# Patient Record
Sex: Male | Born: 1960 | Hispanic: Yes | Marital: Single | Smoking: Never smoker
Health system: Southern US, Community
[De-identification: ages and names within clinical notes are randomized; demographics above are authoritative.]

## PROBLEM LIST (undated history)

## (undated) ENCOUNTER — Emergency Department (HOSPITAL_COMMUNITY): Payer: Self-pay

## (undated) DIAGNOSIS — I10 Essential (primary) hypertension: Secondary | ICD-10-CM

## (undated) DIAGNOSIS — E119 Type 2 diabetes mellitus without complications: Secondary | ICD-10-CM

## (undated) DIAGNOSIS — K219 Gastro-esophageal reflux disease without esophagitis: Secondary | ICD-10-CM

## (undated) DIAGNOSIS — N289 Disorder of kidney and ureter, unspecified: Secondary | ICD-10-CM

## (undated) DIAGNOSIS — I509 Heart failure, unspecified: Secondary | ICD-10-CM

## (undated) HISTORY — DX: Heart failure, unspecified: I50.9

---

## 2015-06-22 HISTORY — PX: AV FISTULA PLACEMENT: SHX1204

## 2017-07-16 ENCOUNTER — Observation Stay (HOSPITAL_COMMUNITY)
Admission: EM | Admit: 2017-07-16 | Discharge: 2017-07-17 | Disposition: A | Discharge status: Home / Self Care | Payer: Self-pay | Attending: Internal Medicine | Admitting: Internal Medicine

## 2017-07-16 ENCOUNTER — Other Ambulatory Visit: Payer: Self-pay

## 2017-07-16 ENCOUNTER — Encounter (HOSPITAL_COMMUNITY): Payer: Self-pay | Admitting: *Deleted

## 2017-07-16 ENCOUNTER — Emergency Department (HOSPITAL_COMMUNITY): Payer: Self-pay

## 2017-07-16 DIAGNOSIS — Z7982 Long term (current) use of aspirin: Secondary | ICD-10-CM | POA: Insufficient documentation

## 2017-07-16 DIAGNOSIS — E8779 Other fluid overload: Principal | ICD-10-CM | POA: Insufficient documentation

## 2017-07-16 DIAGNOSIS — I878 Other specified disorders of veins: Secondary | ICD-10-CM

## 2017-07-16 DIAGNOSIS — Z9115 Patient's noncompliance with renal dialysis: Secondary | ICD-10-CM | POA: Insufficient documentation

## 2017-07-16 DIAGNOSIS — Z79899 Other long term (current) drug therapy: Secondary | ICD-10-CM | POA: Insufficient documentation

## 2017-07-16 DIAGNOSIS — K219 Gastro-esophageal reflux disease without esophagitis: Secondary | ICD-10-CM | POA: Insufficient documentation

## 2017-07-16 DIAGNOSIS — N2581 Secondary hyperparathyroidism of renal origin: Secondary | ICD-10-CM | POA: Insufficient documentation

## 2017-07-16 DIAGNOSIS — R262 Difficulty in walking, not elsewhere classified: Secondary | ICD-10-CM | POA: Insufficient documentation

## 2017-07-16 DIAGNOSIS — I132 Hypertensive heart and chronic kidney disease with heart failure and with stage 5 chronic kidney disease, or end stage renal disease: Secondary | ICD-10-CM | POA: Insufficient documentation

## 2017-07-16 DIAGNOSIS — Z800 Family history of malignant neoplasm of digestive organs: Secondary | ICD-10-CM

## 2017-07-16 DIAGNOSIS — E8770 Fluid overload, unspecified: Secondary | ICD-10-CM

## 2017-07-16 DIAGNOSIS — D649 Anemia, unspecified: Secondary | ICD-10-CM | POA: Insufficient documentation

## 2017-07-16 DIAGNOSIS — E8889 Other specified metabolic disorders: Secondary | ICD-10-CM | POA: Insufficient documentation

## 2017-07-16 DIAGNOSIS — Z833 Family history of diabetes mellitus: Secondary | ICD-10-CM

## 2017-07-16 DIAGNOSIS — Z992 Dependence on renal dialysis: Secondary | ICD-10-CM | POA: Insufficient documentation

## 2017-07-16 DIAGNOSIS — E1122 Type 2 diabetes mellitus with diabetic chronic kidney disease: Secondary | ICD-10-CM | POA: Insufficient documentation

## 2017-07-16 DIAGNOSIS — R0600 Dyspnea, unspecified: Secondary | ICD-10-CM

## 2017-07-16 DIAGNOSIS — Z8249 Family history of ischemic heart disease and other diseases of the circulatory system: Secondary | ICD-10-CM

## 2017-07-16 DIAGNOSIS — N186 End stage renal disease: Secondary | ICD-10-CM | POA: Insufficient documentation

## 2017-07-16 DIAGNOSIS — R0602 Shortness of breath: Secondary | ICD-10-CM

## 2017-07-16 DIAGNOSIS — I509 Heart failure, unspecified: Secondary | ICD-10-CM | POA: Insufficient documentation

## 2017-07-16 HISTORY — DX: Type 2 diabetes mellitus without complications: E11.9

## 2017-07-16 HISTORY — DX: Essential (primary) hypertension: I10

## 2017-07-16 HISTORY — DX: Gastro-esophageal reflux disease without esophagitis: K21.9

## 2017-07-16 HISTORY — DX: Disorder of kidney and ureter, unspecified: N28.9

## 2017-07-16 LAB — BASIC METABOLIC PANEL
ANION GAP: 19 — AB (ref 5–15)
BUN: 119 mg/dL — ABNORMAL HIGH (ref 6–20)
CALCIUM: 8.3 mg/dL — AB (ref 8.9–10.3)
CO2: 26 mmol/L (ref 22–32)
Chloride: 97 mmol/L — ABNORMAL LOW (ref 101–111)
Creatinine, Ser: 14.66 mg/dL — ABNORMAL HIGH (ref 0.61–1.24)
GFR, EST AFRICAN AMERICAN: 4 mL/min — AB (ref >60)
GFR, EST NON AFRICAN AMERICAN: 3 mL/min — AB (ref >60)
Glucose, Bld: 182 mg/dL — ABNORMAL HIGH (ref 65–99)
Potassium: 5.6 mmol/L — ABNORMAL HIGH (ref 3.5–5.1)
SODIUM: 142 mmol/L (ref 135–145)

## 2017-07-16 LAB — CBC
HCT: 37.9 % — ABNORMAL LOW (ref 39.0–52.0)
HEMOGLOBIN: 12.2 g/dL — AB (ref 13.0–17.0)
MCH: 27.2 pg (ref 26.0–34.0)
MCHC: 32.2 g/dL (ref 30.0–36.0)
MCV: 84.6 fL (ref 78.0–100.0)
Platelets: 114 10*3/uL — ABNORMAL LOW (ref 150–400)
RBC: 4.48 MIL/uL (ref 4.22–5.81)
RDW: 18.7 % — ABNORMAL HIGH (ref 11.5–15.5)
WBC: 5.7 10*3/uL (ref 4.0–10.5)

## 2017-07-16 MED ORDER — AMLODIPINE BESYLATE 10 MG PO TABS
10.0000 mg | ORAL_TABLET | Freq: Every day | ORAL | Status: DC
  Administered 2017-07-17: 10 mg via ORAL
  Filled 2017-07-16 (×2): qty 1

## 2017-07-16 MED ORDER — ISOSORB DINITRATE-HYDRALAZINE 20-37.5 MG PO TABS
1.0000 | ORAL_TABLET | Freq: Three times a day (TID) | ORAL | Status: DC
  Administered 2017-07-16 – 2017-07-17 (×2): 1 via ORAL
  Filled 2017-07-16 (×3): qty 1

## 2017-07-16 MED ORDER — PANTOPRAZOLE SODIUM 40 MG PO TBEC
40.0000 mg | DELAYED_RELEASE_TABLET | Freq: Every day | ORAL | Status: DC
  Administered 2017-07-17: 40 mg via ORAL
  Filled 2017-07-16 (×2): qty 1

## 2017-07-16 MED ORDER — PROMETHAZINE HCL 25 MG PO TABS: 12.5000 mg | ORAL_TABLET | Freq: Four times a day (QID) | ORAL | Status: DC | PRN

## 2017-07-16 MED ORDER — CARVEDILOL 25 MG PO TABS
25.0000 mg | ORAL_TABLET | Freq: Two times a day (BID) | ORAL | Status: DC
  Administered 2017-07-16 – 2017-07-17 (×2): 25 mg via ORAL
  Filled 2017-07-16 (×2): qty 1

## 2017-07-16 MED ORDER — SUCRALFATE 1 GM/10ML PO SUSP
1.0000 g | Freq: Three times a day (TID) | ORAL | Status: DC
  Administered 2017-07-16 – 2017-07-17 (×2): 1 g via ORAL
  Filled 2017-07-16 (×4): qty 10

## 2017-07-16 MED ORDER — RENA-VITE PO TABS
1.0000 | ORAL_TABLET | Freq: Every day | ORAL | Status: DC
  Administered 2017-07-16: 1 via ORAL
  Filled 2017-07-16 (×2): qty 1

## 2017-07-16 MED ORDER — ACETAMINOPHEN 650 MG RE SUPP: 650.0000 mg | Freq: Four times a day (QID) | RECTAL | Status: DC | PRN

## 2017-07-16 MED ORDER — ASPIRIN EC 81 MG PO TBEC
81.0000 mg | DELAYED_RELEASE_TABLET | Freq: Every day | ORAL | Status: DC
  Administered 2017-07-17: 81 mg via ORAL
  Filled 2017-07-16: qty 1

## 2017-07-16 MED ORDER — LISINOPRIL 40 MG PO TABS
40.0000 mg | ORAL_TABLET | Freq: Every day | ORAL | Status: DC
  Administered 2017-07-17: 40 mg via ORAL
  Filled 2017-07-16: qty 1

## 2017-07-16 MED ORDER — ACETAMINOPHEN 325 MG PO TABS: 650.0000 mg | ORAL_TABLET | Freq: Four times a day (QID) | ORAL | Status: DC | PRN

## 2017-07-16 MED ORDER — FERRIC CITRATE 1 GM 210 MG(FE) PO TABS
420.0000 mg | ORAL_TABLET | Freq: Three times a day (TID) | ORAL | Status: DC
  Administered 2017-07-17: 420 mg via ORAL
  Filled 2017-07-16 (×3): qty 2

## 2017-07-16 MED ORDER — HEPARIN SODIUM (PORCINE) 5000 UNIT/ML IJ SOLN
5000.0000 [IU] | Freq: Three times a day (TID) | INTRAMUSCULAR | Status: DC
  Administered 2017-07-16 – 2017-07-17 (×3): 5000 [IU] via SUBCUTANEOUS
  Filled 2017-07-16 (×3): qty 1

## 2017-07-16 MED ORDER — SODIUM CHLORIDE 0.9% FLUSH
3.0000 mL | Freq: Two times a day (BID) | INTRAVENOUS | Status: DC
  Administered 2017-07-16: 3 mL via INTRAVENOUS

## 2017-07-16 NOTE — ED Notes (Signed)
Pt ambulated to room from waiting room, tolerated well. 

## 2017-07-16 NOTE — ED Notes (Signed)
Attempted to call report

## 2017-07-16 NOTE — ED Provider Notes (Signed)
[facility] EMERGENCY DEPARTMENT Provider Note   CSN: [id number] Arrival date & time: 07/16/17  1011     History   Chief Complaint Chief Complaint  Patient presents with  . Shortness of Breath  . Vascular Access Problem    Missed dialysis    HPI Roger Rollins is a 56 y.o. male.  HPI   Roger Rollins is a 56 y.o. male, with a history of DM, GERD, HTN, and renal failure on dialysis, presenting to the ED with shortness of breath and peripheral edema for the last 3 days.  States he has moved from [place] to [place] last week. Was told he was cleared to move to his new dialysis center on [address] in [place]. Went there today and was told they were not ready for him because they did not have a H&P from the new MD. Last dialysis Thursday, November 22.  Complains of shortness of breath, orthopnea, chest tightness with orthopnea, as well as worsening peripheral edema.  Denies fever/chills, vomiting/diarrhea, cough, or any other complaints.  Past Medical History:  Diagnosis Date  . Diabetes mellitus without complication (HCC)   . GERD (gastroesophageal reflux disease)   . Hypertension   . Renal disorder     Patient Active Problem List   Diagnosis Date Noted  . Dyspnea 07/16/2017    Past Surgical History:  Procedure Laterality Date  . AV FISTULA PLACEMENT         Home Medications    Prior to Admission medications   Medication Sig Start Date End Date Taking? Authorizing Provider  amLODipine (NORVASC) 10 MG tablet Take 10 mg by mouth daily.   Yes [provider]  aspirin EC 81 MG tablet Take 81 mg by mouth daily.   Yes [provider]  calcium carbonate (OS-CAL) 1250 (500 Ca) MG chewable tablet Chew 1 tablet by mouth 2 (two) times daily.   Yes [provider]  carvedilol (COREG) 25 MG tablet Take 25 mg by mouth 2 (two) times daily with a meal.   Yes [provider]  ferric citrate (AURYXIA) 1 GM 210  MG(Fe) tablet Take 210 mg by mouth daily.   Yes [provider]  Iron Polysacch Cmplx-B12-FA 150-0.025-1 MG TABS Take 150 mg by mouth 2 (two) times daily at 10 AM and 5 PM.   Yes [provider]  isosorbide-hydrALAZINE (BIDIL) 20-37.5 MG tablet Take 1 tablet by mouth 3 (three) times daily.   Yes [provider]  lisinopril (PRINIVIL,ZESTRIL) 40 MG tablet Take 40 mg by mouth daily.   Yes [provider]  omeprazole (PRILOSEC) 20 MG capsule Take 20 mg by mouth 2 (two) times daily before a meal.   Yes [provider]  sucralfate (CARAFATE) 1 GM/10ML suspension Take 1 g by mouth 4 (four) times daily -  with meals and at bedtime.   Yes [provider]    Family History No family history on file.  Social History Social History   Tobacco Use  . Smoking status: Never Smoker  . Smokeless tobacco: Never Used  Substance Use Topics  . Alcohol use: No    Frequency: Never  . Drug use: No     Allergies   Patient has no known allergies.   Review of Systems Review of Systems  Constitutional: Negative for chills, diaphoresis and fever.  Respiratory: Positive for shortness of breath.   Cardiovascular: Positive for chest pain and leg swelling.  Gastrointestinal: Positive for nausea. Negative for vomiting.  Neurological: Negative for dizziness and light-headedness.  All other systems reviewed and are negative.    Physical Exam Updated Vital Signs BP (!) 174/88   Pulse 69   Temp 97.7 F (36.5 C) (Oral)   Resp 20   SpO2 97%   Physical Exam  Constitutional: He appears well-developed and well-nourished. No distress.  HENT:  Head: Normocephalic and atraumatic.  Eyes: Conjunctivae are normal.  Neck: Neck supple.  Cardiovascular: Normal rate, regular rhythm, normal heart sounds and intact distal pulses.  Pulmonary/Chest: He has rales in the right lower field and the left lower field.  SPO2 92% on room air. Orthopnea.   Abdominal:  Soft. There is no tenderness. There is no guarding.  Musculoskeletal: He exhibits edema.  Bilateral lower extremity pitting edema.  Lymphadenopathy:    He has no cervical adenopathy.  Neurological: He is alert.  Skin: Skin is warm and dry. He is not diaphoretic.  Psychiatric: He has a normal mood and affect. His behavior is normal.  Nursing note and vitals reviewed.    ED Treatments / Results  Labs (all labs ordered are listed, but only abnormal results are displayed) Labs Reviewed  BASIC METABOLIC PANEL - Abnormal; Notable for the following components:      Result Value   Potassium 5.6 (*)    Chloride 97 (*)    Glucose, Bld 182 (*)    BUN 119 (*)    Creatinine, Ser 14.66 (*)    Calcium 8.3 (*)    GFR calc non Af Amer 3 (*)    GFR calc Af Amer 4 (*)    Anion gap 19 (*)    All other components within normal limits  CBC - Abnormal; Notable for the following components:   Hemoglobin 12.2 (*)    HCT 37.9 (*)    RDW 18.7 (*)    Platelets 114 (*)    All other components within normal limits    EKG  EKG Interpretation  Date/Time:  Monday July 16 2017 10:43:48 EST Ventricular Rate:  66 PR Interval:  168 QRS Duration: 162 QT Interval:  486 QTC Calculation: 509 R Axis:   -93 Text Interpretation:  Normal sinus rhythm Right bundle branch block , plus right ventricular hypertrophy Minimal voltage criteria for LVH, may be normal variant Abnormal ECG No significant change since last tracing Confirmed by PowayMackuen, Courteney ([id number]) on 07/16/2017 12:49:22 PM       Radiology Dg Chest Portable 1 View  Result Date: 07/16/2017 CLINICAL DATA:  Shortness breath, patient on dialysis EXAM: PORTABLE CHEST 1 VIEW COMPARISON:  None. FINDINGS: There is mild bilateral interstitial thickening. There is no focal consolidation. There is no pleural effusion or pneumothorax. There is cardiomegaly. The osseous structures are unremarkable. IMPRESSION: 1. Cardiomegaly with mild pulmonary vascular  congestion. Electronically Signed   By: Elige KoHetal  Patel   On: 07/16/2017 13:13    Procedures Procedures (including critical care time)  Medications Ordered in ED Medications - No data to display   Initial Impression / Assessment and Plan / ED Course  I have reviewed the triage vital signs and the nursing notes.  Pertinent labs & imaging results that were available during my care of the patient were reviewed by me and considered in my medical decision making (see chart for details).  Clinical Course as of Jul 16 1502  Mon Jul 16, 2017  1354 Spoke with Dr. Lowell GuitarPowell, nephrologist.  States he will set up dialysis for the patient here in the hospital.  Requests we admit via hospitalist.  [SJ]  1458 Spoke with Dr. Johnny Bridge, IM resident. Agrees to admit the patient.   [SJ]    Clinical Course User Index [SJ] Joy, Shawn C, PA-C   Patient presents with shortness of breath and peripheral edema in the setting of overdue dialysis. Patient is nontoxic appearing, afebrile, not tachycardic, not tachypneic, not hypotensive, and is in no apparent distress.  Admission for dialysis today due to evidence of fluid overload.   Findings and plan of care discussed with Bary Castilla, MD. Dr. Corlis Leak personally evaluated and examined this patient.  Vitals:   07/16/17 1315 07/16/17 1330 07/16/17 1345 07/16/17 1400  BP: (!) 183/86 (!) 186/87 (!) 184/89 (!) 173/79  Pulse: 63 63 64 63  Resp: 14 10 12 12   Temp:      TempSrc:      SpO2: 95% 96% 95%      Final Clinical Impressions(s) / ED Diagnoses   Final diagnoses:  Shortness of breath    ED Discharge Orders    None       Concepcion Living 07/16/17 1505    Abelino Derrick, MD 07/17/17 [phone]

## 2017-07-16 NOTE — ED Triage Notes (Addendum)
Pt states he has been receiving dialysis in [place] and then moved here and had it set up.  Pt states he went today and they said they are not aware of him.  Sent here because they do need his history.  Last HD was Thursdays (TTS schedule).  Missed Saturday's dialysis.  Pt reports sob and swelling.  Legs hurt

## 2017-07-16 NOTE — Consult Note (Signed)
[facility] Renal Consultation Note  Indication for Consultation:  Management of ESRD/hemodialysis; anemia, hypertension/volume and secondary hyperparathyroidism  HPI: Roger Rollins is a 56 y.o. 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 He had plans to transfer to [facility] here (however, not accepted yet by Dr. Jimmy Footman as all transfer paperwork not complete )He decided to move here anyway. Now cos sob, swelling of my legs ,secondary to  missing HD Sat Also, "I Overdid it with [employer] in [place]. With son "). He reports usually minimal uf at op hd secondary to exercising  daily  and little wt gains between txs." He denies cough, fevers, chills, vomiting , diarrhea  Or other cos.  Denies  Any recent problems using his LUA AVF .   Reports moving to [facility] to be closer to daughter/ son and soon to be grand baby  here.    Past Medical History:  Diagnosis Date  . Diabetes mellitus without complication ([place])   . GERD (gastroesophageal reflux disease)   . Hypertension   . Renal disorder     Past Surgical History:  Procedure Laterality Date  . AV FISTULA PLACEMENT       No family history on file.    reports that  has never smoked. he has never used smokeless tobacco. He reports that he does not drink alcohol or use drugs.  No Known Allergies  Prior to Admission medications   Medication Sig Start Date End Date Taking? Authorizing Provider  amLODipine (NORVASC) 10 MG tablet Take 10 mg by mouth daily.   Yes [provider]  aspirin EC 81 MG tablet Take 81 mg by mouth daily.   Yes [provider]  calcium carbonate (OS-CAL) 1250 (500 Ca) MG chewable tablet Chew 1 tablet by mouth 2 (two) times daily.   Yes [provider]  carvedilol (COREG) 25 MG tablet Take 25 mg by mouth 2 (two) times daily with a meal.   Yes [provider]  ferric citrate (AURYXIA) 1  GM 210 MG(Fe) tablet Take 210 mg by mouth daily.   Yes [provider]  Iron Polysacch Cmplx-B12-FA 150-0.025-1 MG TABS Take 150 mg by mouth 2 (two) times daily at 10 AM and 5 PM.   Yes [provider]  isosorbide-hydrALAZINE (BIDIL) 20-37.5 MG tablet Take 1 tablet by mouth 3 (three) times daily.   Yes [provider]  lisinopril (PRINIVIL,ZESTRIL) 40 MG tablet Take 40 mg by mouth daily.   Yes [provider]  omeprazole (PRILOSEC) 20 MG capsule Take 20 mg by mouth 2 (two) times daily before a meal.   Yes [provider]  sucralfate (CARAFATE) 1 GM/10ML suspension Take 1 g by mouth 4 (four) times daily -  with meals and at bedtime.   Yes [provider]     Anti-infectives (From admission, onward)   None      Results for orders placed or performed during the hospital encounter of 07/16/17 (from the past 48 hour(s))  Basic metabolic panel     Status: Abnormal   Collection Time: 07/16/17 10:35 AM  Result Value Ref Range   Sodium 142 135 - 145 mmol/L   Potassium 5.6 (H) 3.5 - 5.1 mmol/L   Chloride 97 (L) 101 - 111 mmol/L   CO2 26 22 - 32 mmol/L   Glucose, Bld 182 (H) 65 - 99 mg/dL   BUN 119 (  H) 6 - 20 mg/dL   Creatinine, Ser 14.66 (H) 0.61 - 1.24 mg/dL   Calcium 8.3 (L) 8.9 - 10.3 mg/dL   GFR calc non Af Amer 3 (L) >60 mL/min   GFR calc Af Amer 4 (L) >60 mL/min    Comment: (NOTE) The eGFR has been calculated using the CKD EPI equation. This calculation has not been validated in all clinical situations. eGFR's persistently <60 mL/min signify possible Chronic Kidney Disease.    Anion gap 19 (H) 5 - 15  CBC     Status: Abnormal   Collection Time: 07/16/17 10:35 AM  Result Value Ref Range   WBC 5.7 4.0 - 10.5 K/uL   RBC 4.48 4.22 - 5.81 MIL/uL   Hemoglobin 12.2 (L) 13.0 - 17.0 g/dL   HCT 37.9 (L) 39.0 - 52.0 %   MCV 84.6 78.0 - 100.0 fL   MCH 27.2 26.0 - 34.0 pg   MCHC 32.2 30.0 - 36.0 g/dL   RDW 18.7 (H) 11.5 - 15.5 %    Platelets 114 (L) 150 - 400 K/uL    Comment: PLATELET COUNT CONFIRMED BY SMEAR    ROS: as in HPI   Physical Exam: Vitals:   07/16/17 1515 07/16/17 1530  BP:  (!) 179/79  Pulse: 68 67  Resp:  (!) 21  Temp:    SpO2: 94%      General: Alert thin Hispanic Male , NAD , Appropriate  OX3  HEENT: [place]  nonicteric , EOMI  Neck: JVD Pos.  Heart: RRR, No m, r, gallop Lungs: Non labored breathing  Faint rales bilat  bases Abdomen: Bs pos. Soft ,epigastric mildly tender, nondistended Extremities: 2-3 + bipedal edema  Skin: no rash, warm dry Neuro: OX4 alert moving all extrem . No asterixis , no acute focal deficits noted  Dialysis Access: LuA AVF  Large aneurysms  noted   Dialysis Orders: Center: [facility]   [facility]   on TTS  .awaitng OP info  EDW  HD [place]   Time 3 hrs  9mn  Heparin NONE . Access LUA AVF     [employer]  mcg IV/HD Epogen   Units IV/HD  Venofer    Other   Assessment/Plan 1. Volume overload sec missed hd and diet indiscretion  2. ESRD -  Uremia sec to missed hd (nl schedule TTS) hd in am sec more emergent pts tonight    [facility] time will be TTS sec shift if Dr. DJimmy Footmanaccepts pt  3. Hypertension/volume  - uf on hd and  on 3 op meds amlodipine, coreg and lisinopril  4. Anemia  - hgb 12.2 no esa  5. Metabolic bone disease -  Binders , fu op records for vit d  6. DM type 2 - diet control per pt   DErnest Haber PA-C [id number]/26/2018, 4:18 PM

## 2017-07-16 NOTE — H&P (Signed)
Date: 07/16/2017               Patient Name:  Roger Rollins MRN: [id number]  DOB: 09/26/1960 Age / Sex: 56 y.o., male   PCP: Patient, No Pcp Per         Medical Service: Internal Medicine Teaching Service         Attending Physician: Dr. Earl LagosNarendra, Nischal, MD    First Contact: Dr. Anthonette LegatoHarden Pager: [phone]  Second Contact: Dr. Johnny BridgeSaraiya Pager: [phone]       After Hours (After 5p/  First Contact Pager: [phone]  weekends / holidays): Second Contact Pager: [phone]   Chief Complaint: SOB, Missed dialysis   History of Present Illness: [name] is a 56 yo M with a past medical history of ESRD on TThS HD, diabetes, hypertension, heart failure who presented to the ED with complaints of lower extremity edema, shortness of breath in the setting of missed HD session.  Patient states he has been in the process of moving from [place] where he was previously on a Tuesday Thursday Saturday HD schedule.  He was under the impression that his dialysis was set up at a local HD center for today.  He states he did not receive a specific time but was told the center had a spot for him and to show up early in the day.  Upon arrival he was told the HD center had not received sufficient information and they were unable to provide dialysis and recommended evaluation in the ED.  His last dialysis session was Thursday, missed scheduled Saturday session.  Since that time he notes bilateral lower extremity swelling, dyspnea on exertion, nausea, intermittent chest pressure.  He states he has never missed a dialysis in the past, makes little urine.  He reports his ideal dry weight is 68.5 kg, has not weighed himself.  He reports a history of diabetes for 19-20 years that he states is now diet controlled as he runs for exercise regularly.  With regard to his heart failure history, he is unsure of details but reports medication adherence and his last echo was in February with his previous  cardiologist.  In the ED, afebrile, HR 69, BP 174/88, 97% on room air.  Labs consistent with ESRD with K 5.6, BUN 119, creatinine 14.6. Nephrology consulted in the ED and he was admitted for further management.   Meds:  Current Meds  Medication Sig  . amLODipine (NORVASC) 10 MG tablet Take 10 mg by mouth daily.  Marland Kitchen. aspirin EC 81 MG tablet Take 81 mg by mouth daily.  . calcium carbonate (OS-CAL) 1250 (500 Ca) MG chewable tablet Chew 1 tablet by mouth 2 (two) times daily.  . carvedilol (COREG) 25 MG tablet Take 25 mg by mouth 2 (two) times daily with a meal.  . ferric citrate (AURYXIA) 1 GM 210 MG(Fe) tablet Take 210 mg by mouth daily.  . Iron Polysacch Cmplx-B12-FA 150-0.025-1 MG TABS Take 150 mg by mouth 2 (two) times daily at 10 AM and 5 PM.  . isosorbide-hydrALAZINE (BIDIL) 20-37.5 MG tablet Take 1 tablet by mouth 3 (three) times daily.  Marland Kitchen. lisinopril (PRINIVIL,ZESTRIL) 40 MG tablet Take 40 mg by mouth daily.  Marland Kitchen. omeprazole (PRILOSEC) 20 MG capsule Take 20 mg by mouth 2 (two) times daily before a meal.  . sucralfate (CARAFATE) 1 GM/10ML suspension Take 1 g by mouth 4 (four) times daily -  with meals and at bedtime.     Allergies: Allergies as of  07/16/2017  . (No Known Allergies)   Past Medical History:  Diagnosis Date  . Diabetes mellitus without complication (HCC)   . GERD (gastroesophageal reflux disease)   . Hypertension   . Renal disorder     Family History: Father: Diabetes, HTN Mother: Diabetes, HTN, pancreatic cancer   Social History: Denies tobacco use, alcohol use, or drug use. Recently moved to [place] from [place].   Review of Systems: A complete ROS was negative except as per HPI.   Physical Exam: Blood pressure (!) 179/79, pulse 67, temperature 97.7 F (36.5 C), temperature source Oral, resp. rate (!) 21, SpO2 94 %. General: Resting in bed comfortably, no acute distress Head: Normocephalic, atraumatic  Eyes: PERRL, no scleral icterus  ENT: JVD present,  moist mucus membranes without exudate CV: Systolic murmur, regular rate and rhythm  Resp: Faint basilar crackles, normal work of breathing, no distress  Abd: Soft, +BS, mild tenderness to palpation Extr: Bilateral pitting LE edema to knee, LUE AVF with areas of dilation  Neuro: Alert and oriented x3  Skin: Warm, dry    EKG: personally reviewed my interpretation is widened QRS with likely RBBB and evidence of ventricular hypertrophy, no prior available for comparison.   CXR: personally reviewed my interpretation is cardiomegaly with prominent vascular markings, no pleural effusions.   Assessment & Plan by Problem: Dypsnea, Volume Overload in setting of ESRD  Patient presenting with dyspnea and signs of volume overload in the setting of missed dialysis session.  Patient reports being adherent and made effort to appropriately transition his dialysis care however it appears the process has not been fully completed.  Nephrology consulted for HD needs, appreciate their assistance.  Admission will hopefully facilitate further outpatient HD. --HD per Nephrology --Daily weights, I/Os, fluid restriction --BMP --Cont home auryxia   Heart Failure, Cardiomegaly Patient reports a history of heart failure but is unsure of specific ejection fraction or echocardiogram findings in the past.  Cardiomegaly is apparent on chest x-ray today and systolic murmur noted on exam.  He is on carvedilol, lisinopril, BiDil.  We will try to obtain records from his previous cardiologist and will consider repeat echo if unable to obtain. --Obtain records  --Cont home coreg 25 bid, bidil 20-37.5 TID   Type 2 Diabetes Patient reports a long history of type 2 diabetes, currently diet controlled as patient has been exercising regularly with running for a long period. --CBG   H/o HTN Patient has a history of hypertension currently on Coreg, lisinopril, BiDil, amlodipine.  Hypertensive on arrival in the setting of fluid  overload and missed dialysis as described above. Will resume home BP meds and should also improve with HD.    Dispo: Admit patient to Observation with expected length of stay less than 2 midnights.  Signed: Ginger CarneHarden, Daphane Odekirk, MD 07/16/2017, 4:10 PM  Pager: [phone]

## 2017-07-17 DIAGNOSIS — E1122 Type 2 diabetes mellitus with diabetic chronic kidney disease: Secondary | ICD-10-CM

## 2017-07-17 DIAGNOSIS — I509 Heart failure, unspecified: Secondary | ICD-10-CM

## 2017-07-17 DIAGNOSIS — R011 Cardiac murmur, unspecified: Secondary | ICD-10-CM

## 2017-07-17 DIAGNOSIS — E8779 Other fluid overload: Principal | ICD-10-CM

## 2017-07-17 DIAGNOSIS — N186 End stage renal disease: Secondary | ICD-10-CM

## 2017-07-17 DIAGNOSIS — Z79899 Other long term (current) drug therapy: Secondary | ICD-10-CM

## 2017-07-17 DIAGNOSIS — Z992 Dependence on renal dialysis: Secondary | ICD-10-CM

## 2017-07-17 DIAGNOSIS — I132 Hypertensive heart and chronic kidney disease with heart failure and with stage 5 chronic kidney disease, or end stage renal disease: Secondary | ICD-10-CM

## 2017-07-17 LAB — COMPREHENSIVE METABOLIC PANEL
ALK PHOS: 81 U/L (ref 38–126)
ALT: 22 U/L (ref 17–63)
AST: 17 U/L (ref 15–41)
Albumin: 3.6 g/dL (ref 3.5–5.0)
Anion gap: 20 — ABNORMAL HIGH (ref 5–15)
BILIRUBIN TOTAL: 0.9 mg/dL (ref 0.3–1.2)
BUN: 126 mg/dL — AB (ref 6–20)
CALCIUM: 8.2 mg/dL — AB (ref 8.9–10.3)
CHLORIDE: 95 mmol/L — AB (ref 101–111)
CO2: 25 mmol/L (ref 22–32)
CREATININE: 15.98 mg/dL — AB (ref 0.61–1.24)
GFR, EST AFRICAN AMERICAN: 3 mL/min — AB (ref >60)
GFR, EST NON AFRICAN AMERICAN: 3 mL/min — AB (ref >60)
Glucose, Bld: 84 mg/dL (ref 65–99)
Potassium: 5.7 mmol/L — ABNORMAL HIGH (ref 3.5–5.1)
Sodium: 140 mmol/L (ref 135–145)
TOTAL PROTEIN: 6.4 g/dL — AB (ref 6.5–8.1)

## 2017-07-17 LAB — CBC
HEMATOCRIT: 35.3 % — AB (ref 39.0–52.0)
HEMOGLOBIN: 11.4 g/dL — AB (ref 13.0–17.0)
MCH: 27.1 pg (ref 26.0–34.0)
MCHC: 32.3 g/dL (ref 30.0–36.0)
MCV: 84 fL (ref 78.0–100.0)
Platelets: 108 10*3/uL — ABNORMAL LOW (ref 150–400)
RBC: 4.2 MIL/uL — AB (ref 4.22–5.81)
RDW: 18.7 % — ABNORMAL HIGH (ref 11.5–15.5)
WBC: 4.9 10*3/uL (ref 4.0–10.5)

## 2017-07-17 LAB — HIV ANTIBODY (ROUTINE TESTING W REFLEX): HIV Screen 4th Generation wRfx: NONREACTIVE

## 2017-07-17 LAB — MRSA PCR SCREENING: MRSA BY PCR: NEGATIVE

## 2017-07-17 MED ORDER — FERRIC CITRATE 1 GM 210 MG(FE) PO TABS: 420.0000 mg | ORAL_TABLET | Freq: Three times a day (TID) | ORAL | 0 refills | Status: AC

## 2017-07-17 NOTE — Progress Notes (Signed)
Rechecked patient's BP 157/68- and pulse was 66. MD notified. Will continue to monitor.

## 2017-07-17 NOTE — Progress Notes (Signed)
Patient's BP was 197/72. Gave Bidil and Lisinopril. MD notified. Instructed to recheck BP in one hour. Orders followed. Will continue to monitor.

## 2017-07-17 NOTE — Evaluation (Signed)
Physical Therapy Evaluation Patient Details Name: Roger PortelaCarlos Harrower MRN: [id number] DOB: 04/23/1961 Today's Date: 07/17/2017   History of Present Illness  56 yo M with a past medical history of ESRD on TThS HD, diabetes, hypertension, heart failure who presented to the ED with complaints of lower extremity edema, shortness of breath in the setting of missed HD session  Clinical Impression  Patient seen for mobility assessment, at this time patient is mobilizing very well, no significant deficits at this time. Patient is independent with all activities. No further acute PT needs, encouraged continued mobility. PT will sign off.     Follow Up Recommendations No PT follow up    Equipment Recommendations  None recommended by PT    Recommendations for Other Services       Precautions / Restrictions        Mobility  Bed Mobility Overal bed mobility: Independent                Transfers Overall transfer level: Independent                  Ambulation/Gait Ambulation/Gait assistance: Independent Ambulation Distance (Feet): 510 Feet Assistive device: None Gait Pattern/deviations: WFL(Within Functional Limits)     General Gait Details: very modest instability  Stairs Stairs: Yes Stairs assistance: Modified independent (Device/Increase time) Stair Management: One rail Left Number of Stairs: 4 General stair comments: no difficulty  Wheelchair Mobility    Modified Rankin (Stroke Patients Only)       Balance Overall balance assessment: No apparent balance deficits (not formally assessed)                                           Pertinent Vitals/Pain Pain Assessment: No/denies pain    Home Living Family/patient expects to be discharged to:: Private residence Living Arrangements: Children Available Help at Discharge: Family Type of Home: House Home Access: Stairs to enter Entrance Stairs-Rails: Can reach both Entrance  Stairs-Number of Steps: 3 Home Layout: One level Home Equipment: None      Prior Function Level of Independence: Independent               Hand Dominance   Dominant Hand: Right    Extremity/Trunk Assessment   Upper Extremity Assessment Upper Extremity Assessment: Overall WFL for tasks assessed    Lower Extremity Assessment Lower Extremity Assessment: Overall WFL for tasks assessed       Communication   Communication: No difficulties  Cognition Arousal/Alertness: Awake/alert Behavior During Therapy: WFL for tasks assessed/performed Overall Cognitive Status: Within Functional Limits for tasks assessed                                        General Comments      Exercises     Assessment/Plan    PT Assessment Patent does not need any further PT services  PT Problem List         PT Treatment Interventions      PT Goals (Current goals can be found in the Care Plan section)  Acute Rehab PT Goals PT Goal Formulation: All assessment and education complete, DC therapy    Frequency     Barriers to discharge        Co-evaluation  AM-PAC PT "6 Clicks" Daily Activity  Outcome Measure Difficulty turning over in bed (including adjusting bedclothes, sheets and blankets)?: None Difficulty moving from lying on back to sitting on the side of the bed? : None Difficulty sitting down on and standing up from a chair with arms (e.g., wheelchair, bedside commode, etc,.)?: None Help needed moving to and from a bed to chair (including a wheelchair)?: None Help needed walking in hospital room?: None Help needed climbing 3-5 steps with a railing? : None 6 Click Score: 24    End of Session   Activity Tolerance: Patient tolerated treatment well Patient left: in chair;with call bell/phone within reach Nurse Communication: Mobility status PT Visit Diagnosis: Difficulty in walking, not elsewhere classified (R26.2)    Time:  1359-1416 PT Time Calculation (min) (ACUTE ONLY): 17 min   Charges:   PT Evaluation $PT Eval Low Complexity: 1 Low     PT G Codes:   PT G-Codes **NOT FOR INPATIENT CLASS** Functional Assessment Tool Used: Clinical judgement Functional Limitation: Mobility: Walking and moving around Mobility: Walking and Moving Around Current Status ([id number](G8978): At least 1 percent but less than 20 percent impaired, limited or restricted Mobility: Walking and Moving Around Goal Status [phone](G8979): At least 1 percent but less than 20 percent impaired, limited or restricted Mobility: Walking and Moving Around Discharge Status [phone](G8980): At least 1 percent but less than 20 percent impaired, limited or restricted    Charlotte Crumbevon Charonda Hefter, PT DPT  Board Certified Neurologic Specialist [phone]   Fabio AsaDevon J Korban Shearer 07/17/2017, 3:16 PM

## 2017-07-17 NOTE — Progress Notes (Signed)
   Subjective: No acute events overnight following admission.  Patient was undergoing dialysis during rounds and stated he was starting to feel better with improved lower extremity edema.  He was thankful he was able to get dialysis and is hopeful his outpatient schedule will be set up.  Objective:  Vital signs in last 24 hours: Vitals:   07/16/17 2010 07/16/17 2049 07/17/17 0003 07/17/17 0513  BP:  (!) 176/80 (!) 163/61 (!) 178/81  Pulse:  75 66 67  Resp:  18  18  Temp:  97.8 F (36.6 C)  97.7 F (36.5 C)  TempSrc:  Oral  Oral  SpO2:  97%  93%  Weight: 161 lb 6 oz (73.2 kg)     Height: 5' 8.5" (1.74 m)      General: Resting in bed undergoing HD, no acute distress CV: Systolic murmur, regular rate and rhythm Resp: Clear anterior breath sounds, normal work of breathing, no distress  Abd: Soft, +BS, improved with minimal tenderness  Extr: Interval improvement in LE edema Neuro: Alert and oriented x3 Skin: Warm, dry      Assessment/Plan:  Volume Overload in setting of ESRD Patient presented with dyspnea and signs of volume overload in the setting of missed dialysis session due to misunderstandings during the transition of his dialysis care from [place].  Nephrology was consulted and he received hemodialysis with improvement today.  Outpatient HD set up for Tuesday Thursday Saturday schedule. --HD per Nephrology --Daily weights, fluid restriction --Home auryxia  H/o Hypertension  Patient has a history of hypertension currently on Coreg, lisinopril, BiDil, amlodipine.  He was hypertensive on arrival and BP remains elevated.  We will continue to monitor after dialysis session and administration of home medications.  H/o Heart Failure, Cardiomegaly  Patient reports a history of heart failure but is unsure of specific ejection fraction or echocardiogram findings, states last echo was in February.  We will attempt to obtain records from previous cardiologist for  clarification on his history.   Dispo: Anticipated discharge in approximately 0-1 day(s) pending outpatient HD.    Ginger CarneHarden, Tereka Thorley, MD 07/17/2017, 8:46 AM Pager: [phone]

## 2017-07-17 NOTE — Progress Notes (Signed)
Roger Rollins to be D/C'd Home per MD order.  Discussed prescriptions and follow up appointments with the patient. Prescriptions given to patient, medication list explained in detail. Pt verbalized understanding.  Allergies as of 07/17/2017   No Known Allergies     Medication List    TAKE these medications   amLODipine 10 MG tablet Commonly known as:  NORVASC Take 10 mg by mouth daily.   aspirin EC 81 MG tablet Take 81 mg by mouth daily.   BIDIL 20-37.5 MG tablet Generic drug:  isosorbide-hydrALAZINE Take 1 tablet by mouth 3 (three) times daily.   calcium carbonate 1250 (500 Ca) MG chewable tablet Commonly known as:  OS-CAL Chew 1 tablet by mouth 2 (two) times daily.   carvedilol 25 MG tablet Commonly known as:  COREG Take 25 mg by mouth 2 (two) times daily with a meal.   ferric citrate 1 GM 210 MG(Fe) tablet Commonly known as:  AURYXIA Take 2 tablets (420 mg total) by mouth 3 (three) times daily with meals. What changed:    how much to take  when to take this   Iron Polysacch Cmplx-B12-FA 150-0.025-1 MG Tabs Take 150 mg by mouth 2 (two) times daily at 10 AM and 5 PM.   lisinopril 40 MG tablet Commonly known as:  PRINIVIL,ZESTRIL Take 40 mg by mouth daily.   omeprazole 20 MG capsule Commonly known as:  PRILOSEC Take 20 mg by mouth 2 (two) times daily before a meal.   sucralfate 1 GM/10ML suspension Commonly known as:  CARAFATE Take 1 g by mouth 4 (four) times daily -  with meals and at bedtime.       Vitals:   07/17/17 1531 07/17/17 1648  BP: (!) 157/68 (!) 172/72  Pulse: 66 67  Resp:  16  Temp:  97.8 F (36.6 C)  SpO2:  92%    Skin clean, dry and intact without evidence of skin break down, no evidence of skin tears noted. IV catheter discontinued intact. Site without signs and symptoms of complications. Dressing and pressure applied. Pt denies pain at this time. No complaints noted.  An After Visit Summary was printed and given to the  patient. Patient escorted via WC, and D/C home via private auto.  Cecil CobbsMolly Weismiller RN [facility] Phone [phone]

## 2017-07-17 NOTE — Procedures (Addendum)
Pt overloaded and getting HD this am.  More comfortable today when compared with yesterday. We plan to arrange OP HD and [facility] completed Lauris PoagAlvin C Velma Agnes   Addendum: [facility] on Tuesday Thursday and Saturdays, second shift!

## 2017-07-17 NOTE — Progress Notes (Signed)
Subjective: Patient had no acute events overnight. Reports some symptomatic improvement with SOB and LE edema. Feels that his overall anxiety about his health is contributing to his discomfort. Pt is currently receiving dialysis, will follow up with nephrology about outpatient HD schedule. Plan for DC today after HD.  Objective: Vital signs in last 24 hours: Vitals:   07/16/17 2010 07/16/17 2049 07/17/17 0003 07/17/17 0513  BP:  (!) 176/80 (!) 163/61 (!) 178/81  Pulse:  75 66 67  Resp:  18  18  Temp:  97.8 F (36.6 C)  97.7 F (36.5 C)  TempSrc:  Oral  Oral  SpO2:  97%  93%  Weight: 73.2 kg (161 lb 6 oz)     Height: 5' 8.5" (1.74 m)      Weight change:   Intake/Output Summary (Last 24 hours) at 07/17/2017 0851 Last data filed at 07/17/2017 0600 Gross per 24 hour  Intake 120 ml  Output 0 ml  Net 120 ml   General appearance: receiving hemodialysis, alert, cooperative, appears older than stated age and no distress Lungs: normal work of breathing, speaking full sentences Heart: regular rate, holosystolic murmur best heard at right sternal border > L sternal border Abdomen: soft, NT/ND Extremities: 1+ pitting edema, palpable AT pulses bilaterally   Lab Results: @LABTEST2 @ Micro Results: Recent Results (from the past 240 hour(s))  MRSA PCR Screening     Status: None   Collection Time: 07/17/17  3:34 AM  Result Value Ref Range Status   MRSA by PCR NEGATIVE NEGATIVE Final    Comment:        The GeneXpert MRSA Assay (FDA approved for NASAL specimens only), is one component of a comprehensive MRSA colonization surveillance program. It is not intended to diagnose MRSA infection nor to guide or monitor treatment for MRSA infections.    Studies/Results: Dg Chest Portable 1 View  Result Date: 07/16/2017 CLINICAL DATA:  Shortness breath, patient on dialysis EXAM: PORTABLE CHEST 1 VIEW COMPARISON:  None. FINDINGS: There is mild bilateral interstitial thickening. There is no  focal consolidation. There is no pleural effusion or pneumothorax. There is cardiomegaly. The osseous structures are unremarkable. IMPRESSION: 1. Cardiomegaly with mild pulmonary vascular congestion. Electronically Signed   By: Elige KoHetal  Patel   On: 07/16/2017 13:13   Medications: I have reviewed the patient's current medications. Scheduled Meds: . amLODipine  10 mg Oral Daily  . aspirin EC  81 mg Oral Daily  . carvedilol  25 mg Oral BID WC  . ferric citrate  420 mg Oral TID WC  . heparin  5,000 Units Subcutaneous Q8H  . isosorbide-hydrALAZINE  1 tablet Oral TID  . lisinopril  40 mg Oral Daily  . multivitamin  1 tablet Oral QHS  . pantoprazole  40 mg Oral Daily  . sodium chloride flush  3 mL Intravenous Q12H  . sucralfate  1 g Oral TID WC & HS   Continuous Infusions: PRN Meds:.acetaminophen **OR** acetaminophen, promethazine Assessment/Plan: Active Problems:   Dyspnea  Dypsnea, Volume Overload in setting of ESRD  Patient presenting with dyspnea and signs of volume overload in the setting of a missed dialysis session. Patient reports consistent HD adherence with appropriate efforts made to transition dialysis care when [place] moved to [place] from [place]; however, reports miscommunication about his transfer between old and new HD center and the process is currently incomplete. Plan for HD during admission and further outpatient HD f/u. Nephrology consulted, recs below. --HD per Nephrology; [facility] --  Daily weights, I/Os, fluid restriction --BMP --Cont home auryxia   Heart Failure, Cardiomegaly Patient reports a history of heart failure with unknown EF or past echo findings. Cardiomegaly and mild pulmonary vascular congestion seen on CXR 11/26. On exam, patient has a holosystolic murmur. Pt is currently on carvedilol, lisinopril, BiDil and reports good med adherence.  We will try to obtain records from his previous cardiologist and will consider repeat echo if unable to  obtain. --Obtain records  --Cont home coreg 25 bid, bidil 20-37.5 TID   Type 2 Diabetes Patient reports a long history of type 2 diabetes, currently diet controlled as patient has been exercising regularly, walking and running for various years.  --CBG   H/o HTN Patient has a history of hypertension, meds as above. Hypertensive on arrival in the setting of fluid overload and missed dialysis as described above. Will resume home BP meds and should also improve with HD.    This is a Psychologist, occupationalMedical Student Note.  The care of the patient was discussed with Dr. Anthonette LegatoHarden and the assessment and plan formulated with their assistance.  Please see their attached note for official documentation of the daily encounter.   LOS: 0 days   Lance MussKouri, Alane Hanssen, Medical Student 07/17/2017, 8:51 AM

## 2017-07-17 NOTE — Discharge Summary (Signed)
Name: Suzie PortelaCarlos Gorsline MRN: [id number] DOB: 1961/06/07 56 y.o. PCP: Patient, No Pcp Per  Date of Admission: 07/16/2017 12:39 PM Date of Discharge: 07/17/2017 Attending Physician: Earl LagosNarendra, Nischal, MD  Discharge Diagnosis: Active Problems:   Dyspnea   Discharge Medications: Allergies as of 07/17/2017   No Known Allergies     Medication List    TAKE these medications   amLODipine 10 MG tablet Commonly known as:  NORVASC Take 10 mg by mouth daily.   aspirin EC 81 MG tablet Take 81 mg by mouth daily.   BIDIL 20-37.5 MG tablet Generic drug:  isosorbide-hydrALAZINE Take 1 tablet by mouth 3 (three) times daily.   calcium carbonate 1250 (500 Ca) MG chewable tablet Commonly known as:  OS-CAL Chew 1 tablet by mouth 2 (two) times daily.   carvedilol 25 MG tablet Commonly known as:  COREG Take 25 mg by mouth 2 (two) times daily with a meal.   ferric citrate 1 GM 210 MG(Fe) tablet Commonly known as:  AURYXIA Take 2 tablets (420 mg total) by mouth 3 (three) times daily with meals. What changed:    how much to take  when to take this   Iron Polysacch Cmplx-B12-FA 150-0.025-1 MG Tabs Take 150 mg by mouth 2 (two) times daily at 10 AM and 5 PM.   lisinopril 40 MG tablet Commonly known as:  PRINIVIL,ZESTRIL Take 40 mg by mouth daily.   omeprazole 20 MG capsule Commonly known as:  PRILOSEC Take 20 mg by mouth 2 (two) times daily before a meal.   sucralfate 1 GM/10ML suspension Commonly known as:  CARAFATE Take 1 g by mouth 4 (four) times daily -  with meals and at bedtime.       Disposition and follow-up:   Mr.Jaziah Love [facility] St[facility].  At the hospital follow up visit please address:  1.  -Ensure ability to attend TThSa HD sessions -Establish care with PCP and ensure all medications accessible  -Consider financial counselor meeting given insurance status -Consider outpatient echo for  characterization of HF hx     2.  Labs / imaging needed at time of follow-up: None  3.  Pending labs/ test needing follow-up: None   Follow-up Appointments:   Hospital Course by problem list:   Volume Overload, Dyspnea in setting of ESRD Patient presented with dyspnea and signs of volume overload in setting of a missed dialysis session.  He was on a Tuesday, Thursday, Saturday HD schedule in [place] and had attempted to transition his dialysis care to [place].  Though he believes this process was complete, he was unable to have dialysis performed and was sent to the ED for further evaluation.  Nephrology was consulted and he received dialysis as an inpatient with improvement.  Outpatient HD was arranged for continued Tuesday, Thursday, Saturday schedule at [facility].  H/o Hypertension He has a history of hypertension on Coreg, lisinopril, BiDil, and amlodipine.  He was hypertensive on arrival and with delayed dosing of his home medications.  His blood pressure improved with dialysis and with his home medications prior to discharge.  No changes were made to his regimen.  H/o Heart Failure, Cardiomegaly Patient reported a history of heart failure but was unsure of specific ejection fraction or echocardiogram findings.  Cardiomegaly noted on chest x-ray (portable AP view).  His last echo was performed in February and records were requested from prior hospital but were not received prior to discharge.  A repeat echocardiogram if necessary can be considered as outpatient for further clarification of his heart function and murmur appreciated on exam.  H/o Diabetes, diet controlled He also has a 19-20-year history of diabetes.  He was previously on medications but is now diet controlled as he has been exercising regularly with almost daily running for a long period.  Discharge Vitals:   BP (!) 157/68 (BP Location: Right Arm)   Pulse 66   Temp 97.9 F (36.6 C) (Oral)   Resp 16    Ht 5' 8.5" (1.74 m)   Wt 149 lb 14.6 oz (68 kg) Comment: stood to scale   SpO2 93%   BMI 22.46 kg/m   Pertinent Labs, Studies, and Procedures:  BMP Latest Ref Rng & Units 07/17/2017 07/16/2017  Glucose 65 - 99 mg/dL 84 409(W182(H)  BUN 6 - 20 mg/dL 119(J126(H) 478(G119(H)  Creatinine 0.61 - 1.24 mg/dL 95.62(Z15.98(H) 30.86(V14.66(H)  Sodium 135 - 145 mmol/L 140 142  Potassium 3.5 - 5.1 mmol/L 5.7(H) 5.6(H)  Chloride 101 - 111 mmol/L 95(L) 97(L)  CO2 22 - 32 mmol/L 25 26  Calcium 8.9 - 10.3 mg/dL 8.2(L) 8.3(L)     Discharge Instructions: Discharge Instructions    Discharge instructions   Complete by:  As directed    It was a pleasure meeting you Mr. Driver. We are glad you were able to get dialysis here and that you have your center set up.  --You will have dialysis Tuesday, Thursday, Saturday in the afternoons (show up to the center at 12 (noon)). The center is the [facility], [address] --We encourage you to establish with a primary care doctor now that you've moved here to [place]. We have made a follow up appointment in the [facility] on Monday December 10th at 1:15 pm to make sure you are doing well. You can also choose to establish with a primary care doctor there if you would like.      Signed: Ginger CarneHarden, Indigo Chaddock, MD 07/17/2017, 3:36 PM   Pager: [phone]

## 2017-07-17 NOTE — Progress Notes (Signed)
PT Cancellation Note  Patient Details Name: Roger PortelaCarlos Rollins MRN: [id number] DOB: 1961/08/07   Cancelled Treatment:    Reason Eval/Treat Not Completed: Patient at procedure or test/unavailable, at HD   [place] [facility] Pecolia Marando 07/17/2017, 8:50 AM

## 2017-07-30 ENCOUNTER — Ambulatory Visit: Payer: Self-pay

## 2017-08-01 ENCOUNTER — Ambulatory Visit (INDEPENDENT_AMBULATORY_CARE_PROVIDER_SITE_OTHER): Payer: Self-pay | Admitting: Internal Medicine

## 2017-08-01 ENCOUNTER — Encounter: Payer: Self-pay | Admitting: Internal Medicine

## 2017-08-01 ENCOUNTER — Telehealth: Payer: Self-pay | Admitting: Internal Medicine

## 2017-08-01 VITALS — BP 183/74 | HR 61 | Temp 97.5°F | Ht 69.5 in | Wt 153.5 lb

## 2017-08-01 DIAGNOSIS — E119 Type 2 diabetes mellitus without complications: Secondary | ICD-10-CM

## 2017-08-01 DIAGNOSIS — Z833 Family history of diabetes mellitus: Secondary | ICD-10-CM

## 2017-08-01 DIAGNOSIS — I509 Heart failure, unspecified: Secondary | ICD-10-CM

## 2017-08-01 DIAGNOSIS — I10 Essential (primary) hypertension: Secondary | ICD-10-CM

## 2017-08-01 DIAGNOSIS — Z79899 Other long term (current) drug therapy: Secondary | ICD-10-CM

## 2017-08-01 DIAGNOSIS — Z992 Dependence on renal dialysis: Secondary | ICD-10-CM

## 2017-08-01 DIAGNOSIS — R011 Cardiac murmur, unspecified: Secondary | ICD-10-CM

## 2017-08-01 DIAGNOSIS — I132 Hypertensive heart and chronic kidney disease with heart failure and with stage 5 chronic kidney disease, or end stage renal disease: Secondary | ICD-10-CM

## 2017-08-01 DIAGNOSIS — Z0189 Encounter for other specified special examinations: Secondary | ICD-10-CM

## 2017-08-01 DIAGNOSIS — E1122 Type 2 diabetes mellitus with diabetic chronic kidney disease: Secondary | ICD-10-CM

## 2017-08-01 DIAGNOSIS — N186 End stage renal disease: Secondary | ICD-10-CM

## 2017-08-01 MED ORDER — ISOSORB DINITRATE-HYDRALAZINE 20-37.5 MG PO TABS: 1.0000 | ORAL_TABLET | Freq: Three times a day (TID) | ORAL | 11 refills | Status: DC

## 2017-08-01 MED ORDER — CARVEDILOL 25 MG PO TABS
25.0000 mg | ORAL_TABLET | Freq: Two times a day (BID) | ORAL | 11 refills | Status: DC
Start: 2017-08-01 — End: 2017-08-01

## 2017-08-01 MED ORDER — CLONIDINE HCL 0.1 MG PO TABS: 0.1000 mg | ORAL_TABLET | Freq: Two times a day (BID) | ORAL | 11 refills | Status: DC

## 2017-08-01 MED ORDER — AMLODIPINE BESYLATE 10 MG PO TABS
10.0000 mg | ORAL_TABLET | Freq: Every day | ORAL | 0 refills | Status: DC
Start: 2017-08-01 — End: 2017-08-01

## 2017-08-01 MED ORDER — CARVEDILOL 25 MG PO TABS: 25.0000 mg | ORAL_TABLET | Freq: Two times a day (BID) | ORAL | 11 refills | Status: DC

## 2017-08-01 MED ORDER — AMLODIPINE BESYLATE 10 MG PO TABS: 10.0000 mg | ORAL_TABLET | Freq: Every day | ORAL | 0 refills | Status: DC

## 2017-08-01 NOTE — Assessment & Plan Note (Addendum)
BP elevated >140/90 on two separate readings today in clinic today. Patient states his previous PCP in [place] had significant difficulty managing BP. Patient reports taking BP at home and his SBP usually ranges from 140-150 on current regimen. Given increased readings today will make changes to current regimen of lisinopril 40 mg, carvedilol 25 mg BID, amlodipine 10 mg, and Bi-DIL 20-37.5mg  TID. Will discontinue lisinopril and add clonidine 0.1 mg BID for BP management. [facility] refer to [facility] for ESRD and HTN management.  Plan: -Continue carvedilol 25 mg BID, Bi-Dil 20-37mg  TID, and amlodipine 10 mg daily -STOP lisinopril 40 mg daily -START clonidine 0.1mg  BID -RTC in 1 month for BP recheck

## 2017-08-01 NOTE — Assessment & Plan Note (Addendum)
Patient reports ESRD 2/2 uncontrolled hypertension. Patient reports 3 year history of dialysis with dry weight around 68 - 68.5kg while undergoing treatment in [place]. He reports fatigue, general malaise, and some SOB after dialysis yesterday, which are all gradually improving. He thinks symptoms are a result of decreased dry weight after being established at new center and wishes to speak to nephrologist regarding his dry weight and symptoms after dialysis. Patient states he knows to keep fluid intake <2L and to limit salt intake. Patient states that he rarely gains significant amounts of weight in between sessions. Will [facility]. Patient will continue 3x weekly dialysis in the meantime.   Plan: -Continue T/Th/Sat dialysis as outpatient -[facility]

## 2017-08-01 NOTE — Progress Notes (Signed)
   CC: Hospital follow up and to establish care with clinic  HPI:  Roger Rollins is a 56 y.o. with PMH of HTN, DM, CHF, and ESRD on dialysis T/Th/Sat who is presenting for hospital follow up and to establish care with the clinic. The patient was recently hospitalized for dyspnea in the setting of missing dialysis. Patient recently moved to the area from [place] where he was previously under the care of a nephrologist and cardiologist. Patient moved to the area because his daughter is pregnant with her first child and he wishes to be closer to her. Patient's symptoms of dyspnea on exertion prior to hospitalization have improved since leaving the hospital. Since discharge the patient has been to dialysis in [place], with his most recent visit yesterday. Patient thinks that they are taking too much fluid off him at dialysis because he felt general malaise after his session, symptoms he previously experienced in [place] when his dry weight was challenged. Other than complaints of improving malaise and fatigue with dialysis, the patient has no acute problems today.  Past Medical History: Past Medical History:  Diagnosis Date  . Diabetes mellitus without complication (HCC)   . GERD (gastroesophageal reflux disease)   . Heart failure (HCC)   . Hypertension   . Renal disorder    Family History: Family History  Problem Relation Age of Onset  . Diabetes type II Mother   . Diabetes type II Father    Past Surgical History: Past Surgical History:  Procedure Laterality Date  . AV FISTULA PLACEMENT Left 06/2015   Social History: Social History   Tobacco Use  . Smoking status: Never Smoker  . Smokeless tobacco: Never Used  Substance Use Topics  . Alcohol use: No    Frequency: Never  . Drug use: No   Review of Systems:   Patient endorses fatigue and general malaise after dialysis, as per HPI Patient denies chest pain, shortness of breath, abdominal pain, diaphoresis,  nausea/vomiting, lower extremity swelling, and change in bowel/bladder habits.  Physical Exam:  Vitals:   08/01/17 1356 08/01/17 1430  BP: (!) 168/70 (!) 183/74  Pulse: 62 61  Temp: (!) 97.5 F (36.4 C)   TempSrc: Oral   SpO2: 100%   Weight: 153 lb 8 oz (69.6 kg)   Height: 5' 9.5" (1.765 m)     Physical Exam  Constitutional: He appears well-developed and well-nourished. No distress.  HENT:  Mouth/Throat: Oropharynx is clear and moist.  Eyes: EOM are normal. Pupils are equal, round, and reactive to light. No scleral icterus.  Cardiovascular: Normal rate, regular rhythm and intact distal pulses. Exam reveals no friction rub.  Murmur (Systolic ejection murmur radiating to bilateral carotids) heard. Fistula in place on LUE  Respiratory: Effort normal. No respiratory distress. He has no wheezes.  No crackles appreciated.  GI: Soft. Bowel sounds are normal. He exhibits no distension. There is no tenderness.  Musculoskeletal: He exhibits no edema (of bilateral lower extremities) or tenderness (of bilateral lower extremities).  Lymphadenopathy:    He has no cervical adenopathy.  Skin: Skin is warm and dry. No rash noted. He is not diaphoretic. No erythema.    Assessment & Plan:   See Encounters Tab for problem based charting.  Patient seen with Dr. Cyndie ChimeGranfortuna.

## 2017-08-01 NOTE — Telephone Encounter (Signed)
Refill Request.  

## 2017-08-01 NOTE — Progress Notes (Signed)
Medicine attending: I personally interviewed and briefly examined this patient on the day of the patient visit and reviewed pertinent clinical ,laboratory, data  with resident physician Dr. Shon HaleMary Beth Nedrud and we discussed a management plan. Patient new to our clinic.  Just moved here from [place].  He has diabetes, hypertension, hypertensive nephropathy.  He has been on dialysis for the last 3 years. On exam I could hear bilateral carotid bruits.  A 2/6 systolic murmur at the second right intercostal space.  Regular rhythm.  Clear lungs.  AV fistula left arm. Blood pressure unacceptably high.  Doubt he is getting much value from high-dose ACE inhibitor which we will discontinue and substitute with clonidine starting at 0.1 mg twice daily and titrate as needed. We will make a formal nephrology referral.  He was seen during recent hospitalization when he required dialysis treatment for fluid overload having missed sessions in the moved to [place].

## 2017-08-01 NOTE — Telephone Encounter (Signed)
Pharm states bidil is not on the im program list, please change to isosorbide and hydralazine in separate scripts. Thanks!

## 2017-08-01 NOTE — Assessment & Plan Note (Signed)
Patient reports current control of diabetes with diet and exercise regimen. Patient reports running 3 miles per day. Per chart review random fasting glucose level while in hospital of 84. Patient encouraged to continue current management for diabetes and will obtain further testing pending insurance approval with financial counselor.    Plan: -A1C at next visit (after financial counselor since patient self-pay today) -Continue diet and exercise regimen

## 2017-08-01 NOTE — Assessment & Plan Note (Addendum)
Patient reports hx of heart failure, although unsure of exact date of diagnosis or type. Patient previously seen cardiologist in [place], but records unavailable. Patient instructed to obtain records from previous cardiologist and bring to next visit. No chest pain or dyspnea on exertion. No current signs or symptoms of heart failure on exam. Will need to refer for echocardiography at next visit.   Plan: -Continue carvedilol and Bi-Dil as below -Refer for echocardiography at next visit, consider referral to local cardiologist as well

## 2017-08-01 NOTE — Patient Instructions (Addendum)
FOLLOW-UP INSTRUCTIONS When: 1 month For: Hypertension  Thank you for seeing us in the clinic today!  You were evaluated for hypertension and end stage renal disease. Your blood pressure was elevated today. We made some changes to your medication as a result. Please STOP taking lisinopril 40 mg daily and START taking clonidine 0.1 mg two times a day. Prior to leaving today please arrange to meet with the clinic financial counselor, Rudell CobbDeborah Hill.   Please return to the clinic in 1 month for follow up of your blood pressure. You were referred to the area's nephrologists, [facility]. You will have to complete financial aid documentation with our clinic prior to that visit so please meet with Rudell Cobbeborah Hill as soon as possible.  If you have any questions or concerns, please call our clinic at [phone] between the hours of 9am-5pm. If you have a problem after these hours, please call [phone] and ask for the internal medicine resident on call. If you feel you are having a medical emergency please call 911.   Thanks, Dr. Jeanella FlatteryMarybeth Coreen [place]

## 2017-08-02 MED ORDER — HYDRALAZINE HCL 25 MG PO TABS: 37.5000 mg | ORAL_TABLET | Freq: Three times a day (TID) | ORAL | 11 refills | Status: DC

## 2017-08-02 MED ORDER — ISOSORBIDE MONONITRATE ER 60 MG PO TB24: 60.0000 mg | ORAL_TABLET | ORAL | 11 refills | Status: DC

## 2017-08-02 NOTE — Addendum Note (Signed)
Addended by: Rozann LeschesNEDRUD, Maycen Degregory A on: 08/02/2017 09:37 AM   Modules accepted: Orders

## 2017-08-02 NOTE — Telephone Encounter (Signed)
It has been taken care by Dr. Pearletha FurlNudred

## 2017-08-08 ENCOUNTER — Ambulatory Visit: Payer: Self-pay

## 2017-08-16 ENCOUNTER — Ambulatory Visit: Payer: Self-pay

## 2017-08-24 MED FILL — AMLODIPINE BESYLATE 10 MG T: 10 | 30 days supply | Qty: 30 | Fill #0

## 2017-08-24 MED FILL — CARVEDILOL 25 MG TABLET: 25 | 30 days supply | Qty: 60 | Fill #0

## 2017-08-24 MED FILL — CloNIDine HCL 0.1 MG TAB: 0.1 | 30 days supply | Qty: 60 | Fill #0

## 2017-08-24 MED FILL — hydrALAZINE HCL 25 MG TABS: 25 | 30 days supply | Qty: 135 | Fill #0

## 2017-08-24 MED FILL — ISOSORBIDE MN ER 60 MG TAB: 60 | 30 days supply | Qty: 30 | Fill #0

## 2017-08-27 ENCOUNTER — Ambulatory Visit: Payer: Self-pay

## 2017-09-03 ENCOUNTER — Telehealth: Payer: Self-pay | Admitting: Internal Medicine

## 2017-09-03 NOTE — Telephone Encounter (Signed)
TALKED WITH PATIENT DAUGHTER TODAY THEY HAVE AN APPT THIS WEEK TO DO MEDICAID APPLICATION, IF HE IS DENIED MEDICAID, THEN SHE WILL BRING IN THE REMAINING PAPER WORK TO COMPLETE HIS APPLICATION FOR GCCN/CAFA.

## 2017-09-07 ENCOUNTER — Other Ambulatory Visit: Payer: Self-pay

## 2017-09-07 ENCOUNTER — Encounter: Payer: Self-pay | Admitting: Internal Medicine

## 2017-09-07 ENCOUNTER — Ambulatory Visit: Payer: Self-pay | Admitting: Internal Medicine

## 2017-09-07 VITALS — BP 141/62 | HR 64 | Temp 97.7°F | Ht 69.5 in | Wt 158.1 lb

## 2017-09-07 DIAGNOSIS — Z992 Dependence on renal dialysis: Secondary | ICD-10-CM

## 2017-09-07 DIAGNOSIS — I10 Essential (primary) hypertension: Secondary | ICD-10-CM

## 2017-09-07 DIAGNOSIS — E1122 Type 2 diabetes mellitus with diabetic chronic kidney disease: Secondary | ICD-10-CM

## 2017-09-07 DIAGNOSIS — I509 Heart failure, unspecified: Secondary | ICD-10-CM

## 2017-09-07 DIAGNOSIS — Z79899 Other long term (current) drug therapy: Secondary | ICD-10-CM

## 2017-09-07 DIAGNOSIS — N186 End stage renal disease: Secondary | ICD-10-CM

## 2017-09-07 DIAGNOSIS — R011 Cardiac murmur, unspecified: Secondary | ICD-10-CM

## 2017-09-07 DIAGNOSIS — R42 Dizziness and giddiness: Secondary | ICD-10-CM

## 2017-09-07 DIAGNOSIS — I120 Hypertensive chronic kidney disease with stage 5 chronic kidney disease or end stage renal disease: Secondary | ICD-10-CM

## 2017-09-07 LAB — GLUCOSE, CAPILLARY: Glucose-Capillary: 94 mg/dL (ref 65–99)

## 2017-09-07 LAB — POCT GLYCOSYLATED HEMOGLOBIN (HGB A1C): HEMOGLOBIN A1C: 5.7

## 2017-09-07 NOTE — Assessment & Plan Note (Signed)
BP Readings from Last 3 Encounters:  09/07/17 (!) 141/62  08/01/17 (!) 183/74  07/17/17 (!) 172/72   His blood pressure improved with the addition of clonidine 0.1 mg twice daily. We checked orthostatic vitals because of his complaint of being dizzy with change in position which he described as lightheadedness, which was negative. He do said that his blood pressure remained with them good range during dialysis with the start of clonidine, as it used to be in 200s before.  -Continue current management with hydralazine, clonidine and amlodipine.

## 2017-09-07 NOTE — Assessment & Plan Note (Signed)
He describes his dizziness as being lightheaded and little off balance.  On questioning patient is not drinking enough water, he was allowed to drink 1.5 L by nephrology but he is drinking less than a liter daily to avoid any fluid overload.  Blood sugar remained within normal limit according to patient as he never brought any glucometer.  He has significant systolic murmur at his upper sternal border radiating to carotids with positive carotid bruit which might be contributing to his dizziness.  His blood pressure remained within normal limit with no orthostatic vitals.  Advised to increase his fluid intake to 1.5 L. We will check echocardiogram to rule out any significant aortic stenosis and carotid Doppler to rule out significant carotid atherosclerosis.

## 2017-09-07 NOTE — Progress Notes (Signed)
   CC: For follow-up of his diabetes and hypertension.  HPI:  [name] is a 57 y.o. with past medical history as listed below who came to the clinic for follow-up of his diabetes and hypertension.  Patient recently moved to this area, and this was his second visit to the clinic.[facility] Kitchen.  He was started on clonidine 0.1 mg twice daily during his previous clinic visit, since his previous office visit he was complaining of dizziness, which she described as lightheadedness and little imbalance while getting up from laying or sitting position.  He denies any room spinning, no associated headache, nausea or vomiting.  His symptoms are more pronounced early the morning and he do improved after eating breakfast.  Patient his blood sugar remained between 115-125.  He denies any dyspnea, chest pain or palpitation. He was also complaining of occasional generalized body ache, denies any fever, focal weakness or local tenderness.  He denies any joint pain.  He is compliant with his dialysis and his last dialysis was yesterday.  Please see assessment and plan for his chronic conditions.  Past Medical History:  Diagnosis Date  . Diabetes mellitus without complication (HCC)   . GERD (gastroesophageal reflux disease)   . Heart failure (HCC)   . Hypertension   . Renal disorder    Review of Systems: Negative except mentioned in HPI.  Physical Exam:  Vitals:   09/07/17 1451  BP: (!) 141/62  Pulse: 64  Temp: 97.7 F (36.5 C)  TempSrc: Oral  SpO2: 100%  Weight: 158 lb 1.6 oz (71.7 kg)  Height: 5' 9.5" (1.765 m)    General: Vital signs reviewed.  Patient is well-developed and well-nourished, in no acute distress and cooperative with exam.  Head: Normocephalic and atraumatic. Eyes: EOMI, conjunctivae normal, no scleral icterus.  Neck: Supple, trachea midline, normal ROM, no JVD, masses, thyromegaly, carotid bruit present.  Cardiovascular: RRR, S1 normal, S2 normal, systolic murmur along  upper  sternal border radiating to carotids. Pulmonary/Chest: Clear to auscultation bilaterally, no wheezes, rales, or rhonchi. Abdominal: Soft, non-tender, non-distended, BS +, no masses, organomegaly, or guarding present.  Musculoskeletal: No joint deformities, erythema, or stiffness, ROM full and nontender. Extremities: No lower extremity edema bilaterally,  pulses symmetric and intact bilaterally. No cyanosis or clubbing. Neurological: A&O x3, Strength is normal and symmetric bilaterally, cranial nerve II-XII are grossly intact, no focal motor deficit, sensory intact to light touch bilaterally.  Skin: Warm, dry and intact. No rashes or erythema. Psychiatric: Normal mood and affect. speech and behavior is normal. Cognition and memory are normal.  Assessment & Plan:   See Encounters Tab for problem based charting.  Patient discussed with Dr. Cyndie ChimeGranfortuna.

## 2017-09-07 NOTE — Assessment & Plan Note (Signed)
His A1c today was 5.7, according to patient his previous A1c was 5.4 no records.  He was asked to bring his previous records.  Currently his diabetes is controlled with diet and exercise.

## 2017-09-07 NOTE — Patient Instructions (Addendum)
Thank you for visiting clinic today. Please keep yourself well-hydrated and drink at least 1.5 later as advised by your kidney doctor. Your blood pressure seems okay, please increase your fluid intake and see if that will improve your dizziness.  Please continue taking your blood pressure medicines as advised. I am sending a referral for echocardiogram, which is an ultrasound of your heart because of your murmur, that might be responsible for your dizziness. Please continue your dialysis according to your schedule.

## 2017-09-07 NOTE — Assessment & Plan Note (Signed)
He is compliant with his dialysis on Tuesday, Thursday and Saturday.  Last dialysis was yesterday. According to patient his hemoglobin yesterday was 8.5, he used to get iron infusions along with erythropoietin, which was not started since he moved to [place].  His nephrologist did  order some iron.  Follow-up according to nephrology protocol.

## 2017-09-10 NOTE — Progress Notes (Signed)
Medicine attending: Medical history, presenting problems, physical findings, and medications, reviewed with resident physician Dr Alethia BertholdSummaya Amin on the day of the patient visit and I concur with her evaluation and management plan. Sugars may be running too low.

## 2017-09-12 ENCOUNTER — Ambulatory Visit (HOSPITAL_COMMUNITY)
Admission: RE | Admit: 2017-09-12 | Discharge: 2017-09-12 | Disposition: A | Discharge status: Home / Self Care | Payer: Self-pay | Source: Ambulatory Visit | Attending: Student in an Organized Health Care Education/Training Program | Admitting: Student in an Organized Health Care Education/Training Program

## 2017-09-12 DIAGNOSIS — I509 Heart failure, unspecified: Secondary | ICD-10-CM

## 2017-09-12 DIAGNOSIS — I420 Dilated cardiomyopathy: Secondary | ICD-10-CM | POA: Insufficient documentation

## 2017-09-12 DIAGNOSIS — I088 Other rheumatic multiple valve diseases: Secondary | ICD-10-CM | POA: Insufficient documentation

## 2017-09-12 DIAGNOSIS — I5030 Unspecified diastolic (congestive) heart failure: Secondary | ICD-10-CM | POA: Insufficient documentation

## 2017-09-12 LAB — ECHOCARDIOGRAM COMPLETE
AVLVOTPG: 6 mmHg
CHL CUP DOP CALC LVOT VTI: 27.6 cm
E decel time: 187 msec
EERAT: 18.28
FS: 25 % — AB (ref 28–44)
IVS/LV PW RATIO, ED: 0.85
LA vol index: 49.2 mL/m2
LA vol: 92.4 mL
LADIAMINDEX: 2.34 cm/m2
LASIZE: 44 mm
LAVOLA4C: 93.5 mL
LEFT ATRIUM END SYS DIAM: 44 mm
LV E/e' medial: 18.28
LV E/e'average: 18.28
LV PW d: 13 mm — AB (ref 0.6–1.1)
LVELAT: 9.9 cm/s
LVOT SV: 87 mL
LVOT area: 3.14 cm2
LVOT diameter: 20 mm
LVOT peak vel: 120 cm/s
MV Dec: 187
MV Peak grad: 13 mmHg
MVPKAVEL: 70.5 m/s
MVPKEVEL: 181 m/s
RV LATERAL S' VELOCITY: 12.2 cm/s
RV TAPSE: 20.3 mm
RV sys press: 64 mmHg
Reg peak vel: 373 cm/s
TDI e' lateral: 9.9
TDI e' medial: 6.64
TRMAXVEL: 373 cm/s

## 2017-09-12 NOTE — Progress Notes (Signed)
  Echocardiogram 2D Echocardiogram has been performed.  Roger Rollins, Roger Rollins 09/12/2017, 10:01 AM

## 2017-09-13 ENCOUNTER — Telehealth: Payer: Self-pay | Admitting: Internal Medicine

## 2017-09-13 NOTE — Telephone Encounter (Signed)
TALKED TO PATIENT'S DAUGHTER STEPHANIE, SHE IS GETTING THE MISSING PAPER TOGETHER TO COMPLETE HIS APPLICATION FOR GCCN & CAFA, WILL BRING IN NEXT WEEK

## 2017-09-25 ENCOUNTER — Telehealth: Payer: Self-pay | Admitting: Internal Medicine

## 2017-09-25 NOTE — Telephone Encounter (Signed)
CALLED PT LEFT MESSAGE I WAS HOLDING HIS APP FOR GCCN/CAFA SINCE 08/29/17, IT WOULD BE SHEDDED NEXT WEEK AND HE WOULD HAVE TO START THE PROCESS OVER, IF HE DID NOT GET THE PAPERS TO ME  TO COMPLETE HIS APPLICATION

## 2017-09-26 ENCOUNTER — Ambulatory Visit (HOSPITAL_COMMUNITY)
Admission: RE | Admit: 2017-09-26 | Discharge: 2017-09-26 | Disposition: A | Discharge status: Home / Self Care | Payer: Self-pay | Source: Ambulatory Visit | Attending: Student in an Organized Health Care Education/Training Program | Admitting: Student in an Organized Health Care Education/Training Program

## 2017-09-26 DIAGNOSIS — I120 Hypertensive chronic kidney disease with stage 5 chronic kidney disease or end stage renal disease: Secondary | ICD-10-CM | POA: Insufficient documentation

## 2017-09-26 DIAGNOSIS — Z992 Dependence on renal dialysis: Secondary | ICD-10-CM | POA: Insufficient documentation

## 2017-09-26 DIAGNOSIS — I6523 Occlusion and stenosis of bilateral carotid arteries: Secondary | ICD-10-CM | POA: Insufficient documentation

## 2017-09-26 DIAGNOSIS — N186 End stage renal disease: Secondary | ICD-10-CM | POA: Insufficient documentation

## 2017-09-26 DIAGNOSIS — I10 Essential (primary) hypertension: Secondary | ICD-10-CM

## 2017-09-26 DIAGNOSIS — E1122 Type 2 diabetes mellitus with diabetic chronic kidney disease: Secondary | ICD-10-CM | POA: Insufficient documentation

## 2017-09-26 DIAGNOSIS — E041 Nontoxic single thyroid nodule: Secondary | ICD-10-CM | POA: Insufficient documentation

## 2017-09-26 NOTE — Progress Notes (Addendum)
*  PRELIMINARY RESULTS* Vascular Ultrasound Carotid Duplex (Doppler) has been completed.   Right Carotid: Velocities in the right ICA are consistent with a 1-39% stenosis. Small hypoechoic thyroid nodule with echogenic focus.    Left Carotid: Velocities in the left ICA are consistent with a 1-39% stenosis. Abnormal left subclavian flow.    Vertebrals:Both vertebral arteries were patent with antegrade flow.     Roger DonningCharlotte C Darrek Rollins 09/26/2017, 10:20 AM

## 2017-10-01 ENCOUNTER — Encounter: Payer: Self-pay | Admitting: *Deleted

## 2017-10-16 ENCOUNTER — Encounter: Payer: Self-pay | Admitting: Internal Medicine

## 2017-10-30 MED FILL — hydrALAZINE HCL 25 MG TABS: 25 | 30 days supply | Qty: 135 | Fill #1

## 2017-10-30 MED FILL — CARVEDILOL 25 MG TABLET: 25 | 30 days supply | Qty: 60 | Fill #1

## 2017-10-30 MED FILL — CloNIDine HCL 0.1 MG TAB: 0.1 | 30 days supply | Qty: 60 | Fill #1

## 2017-10-30 MED FILL — ISOSORBIDE MN ER 60 MG TAB: 60 | 30 days supply | Qty: 30 | Fill #1

## 2017-11-14 NOTE — Addendum Note (Signed)
Addended by: Neomia DearPOWERS, Sharlot Sturkey E on: 11/14/2017 06:35 PM   Modules accepted: Orders

## 2017-11-14 NOTE — Addendum Note (Signed)
Addended by: Neomia DearPOWERS, Marg Macmaster E on: 11/14/2017 06:33 PM   Modules accepted: Orders

## 2017-11-15 ENCOUNTER — Other Ambulatory Visit: Payer: Self-pay | Admitting: Internal Medicine

## 2017-11-15 DIAGNOSIS — Z992 Dependence on renal dialysis: Principal | ICD-10-CM

## 2017-11-15 DIAGNOSIS — N186 End stage renal disease: Secondary | ICD-10-CM

## 2017-11-15 MED FILL — AMLODIPINE BESYLATE 10 MG T: 10 | 31 days supply | Qty: 31 | Fill #0

## 2017-11-15 NOTE — Telephone Encounter (Signed)
Next appt scheduled 4/26 with PCP. 

## 2017-12-14 ENCOUNTER — Ambulatory Visit (INDEPENDENT_AMBULATORY_CARE_PROVIDER_SITE_OTHER): Payer: Self-pay | Admitting: Internal Medicine

## 2017-12-14 ENCOUNTER — Other Ambulatory Visit: Payer: Self-pay

## 2017-12-14 ENCOUNTER — Encounter: Payer: Self-pay | Admitting: Internal Medicine

## 2017-12-14 VITALS — BP 122/55 | HR 60 | Temp 97.8°F | Ht 69.5 in | Wt 155.9 lb

## 2017-12-14 DIAGNOSIS — I771 Stricture of artery: Secondary | ICD-10-CM

## 2017-12-14 DIAGNOSIS — E1122 Type 2 diabetes mellitus with diabetic chronic kidney disease: Secondary | ICD-10-CM

## 2017-12-14 DIAGNOSIS — Z7982 Long term (current) use of aspirin: Secondary | ICD-10-CM

## 2017-12-14 DIAGNOSIS — I10 Essential (primary) hypertension: Secondary | ICD-10-CM

## 2017-12-14 DIAGNOSIS — R011 Cardiac murmur, unspecified: Secondary | ICD-10-CM

## 2017-12-14 DIAGNOSIS — I509 Heart failure, unspecified: Secondary | ICD-10-CM

## 2017-12-14 DIAGNOSIS — I5032 Chronic diastolic (congestive) heart failure: Secondary | ICD-10-CM

## 2017-12-14 DIAGNOSIS — I132 Hypertensive heart and chronic kidney disease with heart failure and with stage 5 chronic kidney disease, or end stage renal disease: Secondary | ICD-10-CM

## 2017-12-14 DIAGNOSIS — N186 End stage renal disease: Secondary | ICD-10-CM

## 2017-12-14 DIAGNOSIS — Z992 Dependence on renal dialysis: Secondary | ICD-10-CM

## 2017-12-14 DIAGNOSIS — R42 Dizziness and giddiness: Secondary | ICD-10-CM

## 2017-12-14 DIAGNOSIS — Z79899 Other long term (current) drug therapy: Secondary | ICD-10-CM

## 2017-12-14 MED ORDER — AMLODIPINE BESYLATE 10 MG PO TABS: 10.0000 mg | ORAL_TABLET | Freq: Every day | ORAL | 3 refills | Status: DC

## 2017-12-14 NOTE — Assessment & Plan Note (Addendum)
BP Readings from Last 3 Encounters:  12/14/17 (!) 122/55  09/07/17 (!) 141/62  08/01/17 (!) 183/74   He was normotensive today. Denies any dizziness.  His dizziness has been improved after drinking more water daily.  -Continue current management with clonidine 0.1 mg twice daily, amlodipine 10 mg daily and hydralazine 37.5 mg 3 times a day.

## 2017-12-14 NOTE — Progress Notes (Signed)
   CC: For follow-up of his hypertension and dizziness.  HPI:  Mr.Balraj Krohn is a 57 y.o. with past medical history as listed below came to the clinic to follow-up of his hypertension and dizziness. Patient denies any more dizziness and overall feeling better, no new complaints.  Please see assessment and plan for his chronic conditions.  Past Medical History:  Diagnosis Date  . Diabetes mellitus without complication (HCC)   . GERD (gastroesophageal reflux disease)   . Heart failure (HCC)   . Hypertension   . Renal disorder    Review of Systems: Negative except mentioned in HPI.  Physical Exam:  Vitals:   12/14/17 1357  BP: (!) 122/55  Pulse: 60  Temp: 97.8 F (36.6 C)  TempSrc: Oral  SpO2: 98%  Weight: 155 lb 14.4 oz (70.7 kg)  Height: 5' 9.5" (1.765 m)    General: Vital signs reviewed.  Patient is well-developed and well-nourished, in no acute distress and cooperative with exam.  Head: Normocephalic and atraumatic. Eyes: EOMI, conjunctivae normal, no scleral icterus.  Neck: Supple, trachea midline, normal ROM, no JVD.  Cardiovascular: RRR, S1 normal, S2 normal, blowing murmur more prominent at both sternal border.  Pulmonary/Chest: Clear to auscultation bilaterally, no wheezes, rales, or rhonchi. Abdominal: Soft, non-tender, non-distended, BS +, no masses, organomegaly, or guarding present.  Extremities: Left upper extremity fistula with good bruit ,no lower extremity edema bilaterally,  pulses symmetric and intact bilaterally. No cyanosis or clubbing. Skin: Warm, dry and intact. No rashes or erythema. Psychiatric: Normal mood and affect. speech and behavior is normal. Cognition and memory are normal.  Assessment & Plan:   See Encounters Tab for problem based charting.  Patient discussed with Dr. Josem KaufmannKlima.

## 2017-12-14 NOTE — Assessment & Plan Note (Signed)
Dizziness has been resolved after increasing water intake daily.

## 2017-12-14 NOTE — Assessment & Plan Note (Addendum)
Echo done in January 2019 shows normal ejection fraction with grade 3 diastolic dysfunction.  No aortic stenosis, pulmonary hypertension with pressure of 64.  Patient denies any chest pain, exertional dyspnea, orthopnea or PND. He wants a referral to see a cardiologist as he used to see them when he was in [place].  Patient does not exhibit any signs of acute heart failure currently. A referral for cardiology was provided. Continue aspirin, Imdur and Coreg.

## 2017-12-14 NOTE — Assessment & Plan Note (Signed)
He is compliant with his dialysis.  Encouraged to continue dialysis according to his schedule on Tuesday, Thursday and Saturday.  He was also recently started on IV iron and Epo because of low hemoglobin according to patient.

## 2017-12-14 NOTE — Assessment & Plan Note (Signed)
He was sent for carotid Doppler during previous office visit because of his dizziness and murmur. He was found to have 1 to 39% of right and left carotid artery stenosis, vertebral arteries were patent, it shows distal left subclavian stenosis.  Patient denies any symptoms of left arm or hand pain, or any tingling or numbness.  Denies any pain during dialysis.  He has left upper arm patent fistula.  We will keep watching, will address this if he becomes symptomatic. Treating left subclavian stenosis with a left AVF can  because steal syndrome and worsen the circulation and left lower arm and hand.

## 2017-12-14 NOTE — Patient Instructions (Signed)
Thank you for visiting clinic today. I am giving you referral to see the cardiologist and eye doctor. I am glad you are doing well today-keep up the good work. Please follow-up in 10361-month.

## 2017-12-14 NOTE — Assessment & Plan Note (Signed)
He has diet-controlled diabetes with last A1c of 5.7.    We will recheck A1c in in July. He was encouraged to continue watching diet and exercise.

## 2017-12-17 NOTE — Progress Notes (Signed)
Case discussed with Dr. Amin at the time of the visit. We reviewed the resident's history and exam and pertinent patient test results. I agree with the assessment, diagnosis, and plan of care documented in the resident's note. 

## 2017-12-21 ENCOUNTER — Ambulatory Visit: Payer: Self-pay

## 2018-01-04 MED FILL — AMLODIPINE BESYLATE 10 MG T: 10 | 30 days supply | Qty: 30 | Fill #0

## 2018-01-13 ENCOUNTER — Encounter: Payer: Self-pay | Admitting: *Deleted

## 2018-01-24 MED FILL — AMLODIPINE BESYLATE 10 MG T: 10 | 31 days supply | Qty: 31 | Fill #1

## 2018-01-24 MED FILL — hydrALAZINE HCL 25 MG TABS: 25 | 30 days supply | Qty: 135 | Fill #2

## 2018-01-24 MED FILL — CloNIDine HCL 0.1 MG TAB: 0.1 | 30 days supply | Qty: 60 | Fill #2

## 2018-01-24 MED FILL — ISOSORBIDE MN ER 60 MG TAB: 60 | 30 days supply | Qty: 30 | Fill #2

## 2018-01-28 NOTE — Addendum Note (Signed)
Addended by: Neomia DearPOWERS, Arbie Reisz E on: 01/28/2018 06:54 PM   Modules accepted: Orders

## 2018-02-18 ENCOUNTER — Encounter: Payer: Self-pay | Admitting: Internal Medicine

## 2018-02-18 ENCOUNTER — Other Ambulatory Visit: Payer: Self-pay

## 2018-02-18 ENCOUNTER — Ambulatory Visit (INDEPENDENT_AMBULATORY_CARE_PROVIDER_SITE_OTHER): Payer: Self-pay | Admitting: Internal Medicine

## 2018-02-18 VITALS — BP 142/68 | HR 66 | Temp 98.2°F | Ht 69.5 in | Wt 156.8 lb

## 2018-02-18 DIAGNOSIS — Z992 Dependence on renal dialysis: Secondary | ICD-10-CM

## 2018-02-18 DIAGNOSIS — I10 Essential (primary) hypertension: Secondary | ICD-10-CM

## 2018-02-18 DIAGNOSIS — E1122 Type 2 diabetes mellitus with diabetic chronic kidney disease: Secondary | ICD-10-CM

## 2018-02-18 DIAGNOSIS — N186 End stage renal disease: Secondary | ICD-10-CM

## 2018-02-18 DIAGNOSIS — R0789 Other chest pain: Secondary | ICD-10-CM

## 2018-02-18 DIAGNOSIS — Z79899 Other long term (current) drug therapy: Secondary | ICD-10-CM

## 2018-02-18 DIAGNOSIS — I132 Hypertensive heart and chronic kidney disease with heart failure and with stage 5 chronic kidney disease, or end stage renal disease: Secondary | ICD-10-CM

## 2018-02-18 DIAGNOSIS — I509 Heart failure, unspecified: Secondary | ICD-10-CM

## 2018-02-18 DIAGNOSIS — E119 Type 2 diabetes mellitus without complications: Secondary | ICD-10-CM

## 2018-02-18 DIAGNOSIS — Z7982 Long term (current) use of aspirin: Secondary | ICD-10-CM

## 2018-02-18 LAB — GLUCOSE, CAPILLARY: Glucose-Capillary: 101 mg/dL — ABNORMAL HIGH (ref 70–99)

## 2018-02-18 LAB — POCT GLYCOSYLATED HEMOGLOBIN (HGB A1C): HEMOGLOBIN A1C: 4.8 % (ref 4.0–5.6)

## 2018-02-18 MED ORDER — LIDO-CAPSAICIN-MEN-METHYL SAL 0.5-0.035-5-20 % EX PTCH: 1.0000 | MEDICATED_PATCH | Freq: Three times a day (TID) | CUTANEOUS | 0 refills | Status: AC | PRN

## 2018-02-18 MED ORDER — OMEPRAZOLE 20 MG PO CPDR: 20.0000 mg | DELAYED_RELEASE_CAPSULE | Freq: Two times a day (BID) | ORAL | 2 refills | Status: DC

## 2018-02-18 MED ORDER — CARVEDILOL 25 MG PO TABS: 25.0000 mg | ORAL_TABLET | Freq: Two times a day (BID) | ORAL | 11 refills | Status: AC

## 2018-02-18 MED ORDER — ISOSORBIDE MONONITRATE ER 60 MG PO TB24
60.0000 mg | ORAL_TABLET | ORAL | 11 refills | Status: AC
Start: 2018-02-18 — End: 2019-02-18

## 2018-02-18 MED ORDER — AMLODIPINE BESYLATE 10 MG PO TABS: 10.0000 mg | ORAL_TABLET | Freq: Every day | ORAL | 3 refills | Status: AC

## 2018-02-18 MED ORDER — HYDRALAZINE HCL 25 MG PO TABS: 37.5000 mg | ORAL_TABLET | Freq: Three times a day (TID) | ORAL | 11 refills | Status: AC

## 2018-02-18 MED ORDER — ACETAMINOPHEN ER 650 MG PO TBCR: 650.0000 mg | EXTENDED_RELEASE_TABLET | Freq: Three times a day (TID) | ORAL | 0 refills | Status: AC | PRN

## 2018-02-18 MED ORDER — CLONIDINE HCL 0.1 MG PO TABS: 0.1000 mg | ORAL_TABLET | Freq: Two times a day (BID) | ORAL | 11 refills | Status: AC

## 2018-02-18 NOTE — Progress Notes (Signed)
   CC: Right upper lateral chest wall pain.  HPI:  [name] is a 57 y.o. with past medical history as listed below came to the clinic with complaint of right upper lateral chest wall pain for the past 2 to 3 days.  He described pain as 7 out of 10, interfere with his sleep, pain increased with deep breathing and movement around the right shoulder.  He denies any other chest pain or shortness of breath.  He could not recall at any inciting event.  Denies any recent illnesses, fever, night sweats or palpitations.  No recent change in his appetite, bowel or bladder.  Please see assessment and plan for his chronic conditions.  Past Medical History:  Diagnosis Date  . Diabetes mellitus without complication (HCC)   . GERD (gastroesophageal reflux disease)   . Heart failure (HCC)   . Hypertension   . Renal disorder    Review of Systems: Negative except mentioned in HPI.  Physical Exam:  Vitals:   02/18/18 1353  BP: (!) 142/68  Pulse: 66  Temp: 98.2 F (36.8 C)  TempSrc: Oral  SpO2: 99%  Weight: 156 lb 12.8 oz (71.1 kg)  Height: 5' 9.5" (1.765 m)   General: Vital signs reviewed.  Patient is well-developed and well-nourished, in no acute distress and cooperative with exam.  Head: Normocephalic and atraumatic. Eyes: EOMI, conjunctivae normal, no scleral icterus.  Neck: Supple, trachea midline, normal ROM, no JVD, masses, thyromegaly, or carotid bruit present.  Chest wall.  Mild tenderness at pectoralis area, no erythema or edema.  Normal range of motion around shoulder girdle.  No focal deficit. Cardiovascular: RRR, S1 normal, S2 normal, systolic transmitted murmur, no gallops, or rubs. Pulmonary/Chest: Clear to auscultation bilaterally, no wheezes, rales, or rhonchi. Abdominal: Soft, non-tender, non-distended, BS +, no masses, organomegaly, or guarding present.  Extremities: Left upper extremity fistula with good bruit, no lower extremity edema bilaterally,  pulses  symmetric and intact bilaterally. No cyanosis or clubbing. Skin: Warm, dry and intact. No rashes or erythema. Psychiatric: Normal mood and affect. speech and behavior is normal. Cognition and memory are normal.  Assessment & Plan:   See Encounters Tab for problem based charting.  Patient discussed with Dr. Cleda DaubE. Hoffman.

## 2018-02-18 NOTE — Patient Instructions (Addendum)
Thank you for visiting clinic today. Your blood pressure was little high but I am not making any changes because you were in pain. I am giving you a prescription for over-the-counter lidocaine patch, you can apply at the affected area and see if that will help. Take Tylenol 650 mg every 8 hourly as needed for pain. You can also try some warm compresses or heating pad to the affected area. Please come to the clinic if your symptoms fail to resolve with the next few days or get worse. If your symptoms improves, we will see you back in 4943-month for your diabetes and high blood pressure.

## 2018-02-21 DIAGNOSIS — R0789 Other chest pain: Secondary | ICD-10-CM | POA: Insufficient documentation

## 2018-02-21 NOTE — Assessment & Plan Note (Signed)
BP Readings from Last 3 Encounters:  02/18/18 (!) 142/68  12/14/17 (!) 122/55  09/07/17 (!) 141/62   His blood pressure was mildly elevated.  We will continue with current management as patient was in pain. We will reassess during next follow-up visit.

## 2018-02-21 NOTE — Assessment & Plan Note (Signed)
Patient has diet-controlled diabetes. His A1c today was 4.8, decreased from 5.7 during previous check.  Congratulated the patient for his good work. Patient is not diabetic at this time.

## 2018-02-21 NOTE — Assessment & Plan Note (Addendum)
He denies any exertional dyspnea or lower extremity edema. No orthopnea or PND. No chest pain.  Compliant with his medication.  Continue Coreg, aspirin and Imdur.

## 2018-02-21 NOTE — Assessment & Plan Note (Signed)
He is compliant with his dialysis on Tuesday, Thursday and Saturday.  Advised to continue dialysis according to his schedule.

## 2018-02-21 NOTE — Assessment & Plan Note (Signed)
His symptoms and exams are more consistent with musculoskeletal pain. Does not appear to be a rotator cuff tendinitis, it appears to be at the area of his pectoralis minor.  No deficit which can indicate a tear.  Patient was advised to use lidocaine patch, heating pad and Tylenol and see if that will help with his pain.

## 2018-02-22 NOTE — Progress Notes (Signed)
Internal Medicine Clinic Attending  I saw and evaluated the patient.  I personally confirmed the key portions of the history and exam documented by Dr. Amin and I reviewed pertinent patient test results.  The assessment, diagnosis, and plan were formulated together and I agree with the documentation in the resident's note. 

## 2018-03-01 MED FILL — CARVEDILOL 25 MG TABLET: 25 | 30 days supply | Qty: 60 | Fill #0

## 2018-03-01 MED FILL — CloNIDine HCL 0.1 MG TAB: 0.1 | 30 days supply | Qty: 60 | Fill #0

## 2018-03-01 MED FILL — ISOSORBIDE MN ER 60 MG TAB: 60 | 30 days supply | Qty: 30 | Fill #0

## 2018-03-01 MED FILL — hydrALAZINE HCL 25 MG TABS: 25 | 30 days supply | Qty: 135 | Fill #0

## 2018-03-01 MED FILL — OMEPRAZOLE 20 MG CAP: 20 | 30 days supply | Qty: 60 | Fill #0

## 2018-03-01 MED FILL — AMLODIPINE BESYLATE 10 MG T: 10 | 30 days supply | Qty: 30 | Fill #0

## 2018-03-15 ENCOUNTER — Encounter: Payer: Self-pay | Admitting: Internal Medicine

## 2018-03-22 ENCOUNTER — Ambulatory Visit: Payer: Self-pay

## 2018-04-02 ENCOUNTER — Other Ambulatory Visit: Payer: Self-pay

## 2018-04-02 ENCOUNTER — Ambulatory Visit (INDEPENDENT_AMBULATORY_CARE_PROVIDER_SITE_OTHER): Payer: Self-pay | Admitting: Vascular Surgery

## 2018-04-02 ENCOUNTER — Encounter: Payer: Self-pay | Admitting: Vascular Surgery

## 2018-04-02 VITALS — BP 174/71 | HR 64 | Resp 20 | Ht 69.0 in | Wt 153.7 lb

## 2018-04-02 DIAGNOSIS — Z992 Dependence on renal dialysis: Secondary | ICD-10-CM

## 2018-04-02 DIAGNOSIS — N186 End stage renal disease: Secondary | ICD-10-CM

## 2018-04-02 NOTE — Progress Notes (Signed)
[facility]  Patient name: Roger PortelaCarlos Rollins MRN: [id number] DOB: 09/29/1960 Sex: male  REASON FOR CONSULT: Valuation of left upper arm AV fistula  HPI: Roger PortelaCarlos Rollins is a 57 y.o. male, who is here today for evaluation of his left upper arm AV fistula.  We have not seen him before in our office.  He reports [facility].  He was noted on his dialysis session today to have a blistering on this and was sent to our office for evaluation.  He does report that he has difficulty with sensitivity to paper tape and uses silk tape without difficulty.  Not had any history of bleeding  Past Medical History:  Diagnosis Date  . Diabetes mellitus without complication (HCC)   . GERD (gastroesophageal reflux disease)   . Heart failure (HCC)   . Hypertension   . Renal disorder     Family History  Problem Relation Age of Onset  . Diabetes type II Mother   . Heart disease Mother   . Diabetes type II Father     SOCIAL HISTORY: Social History   Socioeconomic History  . Marital status: Single    Spouse name: Not on file  . Number of children: Not on file  . Years of education: Not on file  . Highest education level: Not on file  Occupational History  . Occupation: unemployed  Social Needs  . Financial resource strain: Not on file  . Food insecurity:    Worry: Not on file    Inability: Not on file  . Transportation needs:    Medical: Not on file    Non-medical: Not on file  Tobacco Use  . Smoking status: Never Smoker  . Smokeless tobacco: Never Used  Substance and Sexual Activity  . Alcohol use: No    Frequency: Never  . Drug use: No  . Sexual activity: Not on file  Lifestyle  . Physical activity:    Days per week: 7 days    Minutes per session: 30 min  . Stress: Not on file  Relationships  . Social connections:    Talks on phone: Not  on file    Gets together: Not on file    Attends religious service: Not on file    Active member of club or organization: Not on file    Attends meetings of clubs or organizations: Not on file    Relationship status: Separated  . Intimate partner violence:    Fear of current or ex partner: Not on file    Emotionally abused: Not on file    Physically abused: Not on file    Forced sexual activity: Not on file  Other Topics Concern  . Not on file  Social History Narrative   Lives with daughter, who is newly pregnant. Patient has son who lives in the area with two kids as well.     No Known Allergies  Current Outpatient Medications  Medication Sig Dispense Refill  . acetaminophen (TYLENOL 8 HOUR) 650 MG CR tablet Take 1 tablet (650 mg total) by mouth every 8 (eight) hours as needed for pain. 30 tablet 0  . amLODipine (NORVASC) 10 MG tablet Take 1 tablet (10 mg total) by mouth daily. IM Program. 90 tablet 3  . aspirin EC 81 MG tablet Take 81 mg by mouth daily.    . calcium carbonate (OS-CAL) 1250 (500 Ca) MG chewable tablet Chew  1 tablet by mouth 2 (two) times daily.    . carvedilol (COREG) 25 MG tablet Take 1 tablet (25 mg total) by mouth 2 (two) times daily with a meal. IM Program. 60 tablet 11  . cloNIDine (CATAPRES) 0.1 MG tablet Take 1 tablet (0.1 mg total) by mouth 2 (two) times daily. 60 tablet 11  . ferric citrate (AURYXIA) 1 GM 210 MG(Fe) tablet Take 2 tablets (420 mg total) by mouth 3 (three) times daily with meals. 270 tablet 0  . hydrALAZINE (APRESOLINE) 25 MG tablet Take 1.5 tablets (37.5 mg total) by mouth 3 (three) times daily. 135 tablet 11  . Iron Polysacch Cmplx-B12-FA 150-0.025-1 MG TABS Take 150 mg by mouth 2 (two) times daily at 10 AM and 5 PM.    . isosorbide mononitrate (IMDUR) 60 MG 24 hr tablet Take 1 tablet (60 mg total) by mouth every morning. 30 tablet 11  . Lido-Capsaicin-Men-Methyl Sal 0.5-0.035-5-20 % PTCH Apply 1 patch topically 3 (three) times daily as  needed. 30 patch 0  . omeprazole (PRILOSEC) 20 MG capsule Take 1 capsule (20 mg total) by mouth 2 (two) times daily before a meal. IM Program. 60 capsule 2  . sucralfate (CARAFATE) 1 GM/10ML suspension Take 1 g by mouth 4 (four) times daily -  with meals and at bedtime.     No current facility-administered medications for this visit.     REVIEW OF SYSTEMS:  [X]  denotes positive finding, [ ]  denotes negative finding Cardiac  Comments:  Chest pain or chest pressure:    Shortness of breath upon exertion:    Short of breath when lying flat:    Irregular heart rhythm:        Vascular    Pain in calf, thigh, or hip brought on by ambulation:    Pain in feet at night that wakes you up from your sleep:     Blood clot in your veins:    Leg swelling:         Pulmonary    Oxygen at home:    Productive cough:     Wheezing:         Neurologic    Sudden weakness in arms or legs:     Sudden numbness in arms or legs:     Sudden onset of difficulty speaking or slurred speech:    Temporary loss of vision in one eye:     Problems with dizziness:         Gastrointestinal    Blood in stool:     Vomited blood:         Genitourinary    Burning when urinating:     Blood in urine:        Psychiatric    Major depression:         Hematologic    Bleeding problems:    Problems with blood clotting too easily:        Skin    Rashes or ulcers: x       Constitutional    Fever or chills:      PHYSICAL EXAM: Vitals:   04/02/18 1146  BP: (!) 174/71  Pulse: 64  Resp: 20  SpO2: 100%  Weight: 153 lb 11.2 oz (69.7 kg)  Height: 5\' 9"  (1.753 m)    GENERAL: The patient is a well-nourished male, in no acute distress. The vital signs are documented above. CARDIOVASCULAR: Affable left radial pulse.  Large dilated cephalic vein fistula from his antecubital space  to the shoulder.  He had a dialysis session today.  I remove the dressings and there was no evidence of disruption of the skin or  breakdown of the fistula.  He does have an area over the fistula just above the antecubital space with a superficial blister less than 1 cm in diameter with serous fluid in this. PULMONARY: There is good air exchange  MUSCULOSKELETAL: There are no major deformities or cyanosis. NEUROLOGIC: No focal weakness or paresthesias are detected. SKIN: There are no ulcers or rashes noted. PSYCHIATRIC: The patient has a normal affect.  DATA:  None  MEDICAL ISSUES: Superficial blister over the distal aspect above the antecubital space of his left upper arm AV fistula.  I do not feel that this puts him at any risk for failure or disruption of his fistula.  Explained that this may resolve spontaneously or the blister may burst and if so he will use Neosporin over this.  We will see [place] again as needed   Larina Earthly, MD [facility] Vascular and Vein Specialists of [facility] Tel [phone] Pager [phone]

## 2018-04-16 ENCOUNTER — Encounter: Payer: Self-pay | Admitting: *Deleted

## 2018-05-27 ENCOUNTER — Encounter: Payer: Self-pay | Admitting: Internal Medicine

## 2018-06-03 MED FILL — CloNIDine HCL 0.1 MG TAB: 0.1 | 30 days supply | Qty: 60 | Fill #3

## 2018-06-03 MED FILL — AMLODIPINE BESYLATE 10 MG T: 10 | 30 days supply | Qty: 30 | Fill #1

## 2018-06-03 MED FILL — ISOSORBIDE MN ER 60 MG TAB: 60 | 30 days supply | Qty: 30 | Fill #1

## 2018-06-03 MED FILL — OMEPRAZOLE 20 MG CPDR: 20 | 30 days supply | Qty: 60 | Fill #1

## 2018-07-08 ENCOUNTER — Encounter: Payer: Self-pay | Admitting: Internal Medicine

## 2018-07-08 ENCOUNTER — Encounter (HOSPITAL_COMMUNITY): Payer: Self-pay | Admitting: *Deleted

## 2018-07-08 ENCOUNTER — Ambulatory Visit (INDEPENDENT_AMBULATORY_CARE_PROVIDER_SITE_OTHER): Payer: Self-pay | Admitting: Internal Medicine

## 2018-07-08 ENCOUNTER — Inpatient Hospital Stay (HOSPITAL_COMMUNITY): Payer: Medicaid Other

## 2018-07-08 ENCOUNTER — Inpatient Hospital Stay (HOSPITAL_COMMUNITY)
Admission: AD | Admit: 2018-07-08 | Discharge: 2018-07-13 | DRG: 417 | Disposition: A | Discharge status: Home / Self Care | Payer: Medicaid Other | Attending: Internal Medicine | Admitting: Internal Medicine

## 2018-07-08 ENCOUNTER — Other Ambulatory Visit: Payer: Self-pay

## 2018-07-08 ENCOUNTER — Encounter (INDEPENDENT_AMBULATORY_CARE_PROVIDER_SITE_OTHER): Payer: Self-pay

## 2018-07-08 VITALS — BP 162/72 | HR 64 | Temp 97.7°F | Ht 69.5 in | Wt 160.3 lb

## 2018-07-08 DIAGNOSIS — E119 Type 2 diabetes mellitus without complications: Secondary | ICD-10-CM

## 2018-07-08 DIAGNOSIS — Z79899 Other long term (current) drug therapy: Secondary | ICD-10-CM

## 2018-07-08 DIAGNOSIS — D62 Acute posthemorrhagic anemia: Secondary | ICD-10-CM | POA: Diagnosis not present

## 2018-07-08 DIAGNOSIS — R011 Cardiac murmur, unspecified: Secondary | ICD-10-CM

## 2018-07-08 DIAGNOSIS — E1122 Type 2 diabetes mellitus with diabetic chronic kidney disease: Secondary | ICD-10-CM

## 2018-07-08 DIAGNOSIS — K219 Gastro-esophageal reflux disease without esophagitis: Secondary | ICD-10-CM | POA: Diagnosis present

## 2018-07-08 DIAGNOSIS — Z992 Dependence on renal dialysis: Secondary | ICD-10-CM

## 2018-07-08 DIAGNOSIS — I132 Hypertensive heart and chronic kidney disease with heart failure and with stage 5 chronic kidney disease, or end stage renal disease: Secondary | ICD-10-CM

## 2018-07-08 DIAGNOSIS — D649 Anemia, unspecified: Secondary | ICD-10-CM

## 2018-07-08 DIAGNOSIS — R0681 Apnea, not elsewhere classified: Secondary | ICD-10-CM | POA: Diagnosis not present

## 2018-07-08 DIAGNOSIS — I509 Heart failure, unspecified: Secondary | ICD-10-CM

## 2018-07-08 DIAGNOSIS — N186 End stage renal disease: Secondary | ICD-10-CM

## 2018-07-08 DIAGNOSIS — K661 Hemoperitoneum: Secondary | ICD-10-CM | POA: Diagnosis present

## 2018-07-08 DIAGNOSIS — R109 Unspecified abdominal pain: Secondary | ICD-10-CM | POA: Diagnosis present

## 2018-07-08 DIAGNOSIS — I10 Essential (primary) hypertension: Secondary | ICD-10-CM

## 2018-07-08 DIAGNOSIS — I5030 Unspecified diastolic (congestive) heart failure: Secondary | ICD-10-CM | POA: Diagnosis present

## 2018-07-08 DIAGNOSIS — K810 Acute cholecystitis: Principal | ICD-10-CM

## 2018-07-08 DIAGNOSIS — R1011 Right upper quadrant pain: Secondary | ICD-10-CM

## 2018-07-08 DIAGNOSIS — I959 Hypotension, unspecified: Secondary | ICD-10-CM | POA: Diagnosis not present

## 2018-07-08 DIAGNOSIS — Z7982 Long term (current) use of aspirin: Secondary | ICD-10-CM

## 2018-07-08 DIAGNOSIS — K8063 Calculus of gallbladder and bile duct with acute cholecystitis with obstruction: Secondary | ICD-10-CM

## 2018-07-08 LAB — CBC WITH DIFFERENTIAL/PLATELET
Abs Immature Granulocytes: 0.03 10*3/uL (ref 0.00–0.07)
BASOS PCT: 0 %
Basophils Absolute: 0 10*3/uL (ref 0.0–0.1)
Eosinophils Absolute: 0 10*3/uL (ref 0.0–0.5)
Eosinophils Relative: 1 %
HCT: 28.1 % — ABNORMAL LOW (ref 39.0–52.0)
Hemoglobin: 8.6 g/dL — ABNORMAL LOW (ref 13.0–17.0)
Immature Granulocytes: 0 %
Lymphocytes Relative: 9 %
Lymphs Abs: 0.6 10*3/uL — ABNORMAL LOW (ref 0.7–4.0)
MCH: 30.2 pg (ref 26.0–34.0)
MCHC: 30.6 g/dL (ref 30.0–36.0)
MCV: 98.6 fL (ref 80.0–100.0)
MONO ABS: 0.4 10*3/uL (ref 0.1–1.0)
MONOS PCT: 6 %
Neutro Abs: 5.7 10*3/uL (ref 1.7–7.7)
Neutrophils Relative %: 84 %
PLATELETS: 147 10*3/uL — AB (ref 150–400)
RBC: 2.85 MIL/uL — ABNORMAL LOW (ref 4.22–5.81)
RDW: 14.6 % (ref 11.5–15.5)
WBC: 6.8 10*3/uL (ref 4.0–10.5)
nRBC: 0 % (ref 0.0–0.2)

## 2018-07-08 LAB — LIPASE, BLOOD: LIPASE: 64 U/L — AB (ref 11–51)

## 2018-07-08 LAB — GLUCOSE, CAPILLARY
GLUCOSE-CAPILLARY: 107 mg/dL — AB (ref 70–99)
GLUCOSE-CAPILLARY: 126 mg/dL — AB (ref 70–99)
GLUCOSE-CAPILLARY: 107 mg/dL — AB (ref 70–99)

## 2018-07-08 LAB — COMPREHENSIVE METABOLIC PANEL
ALT: 34 U/L (ref 0–44)
AST: 24 U/L (ref 15–41)
Albumin: 3.6 g/dL (ref 3.5–5.0)
Alkaline Phosphatase: 71 U/L (ref 38–126)
Anion gap: 15 (ref 5–15)
BUN: 75 mg/dL — ABNORMAL HIGH (ref 6–20)
CHLORIDE: 103 mmol/L (ref 98–111)
CO2: 23 mmol/L (ref 22–32)
CREATININE: 9.86 mg/dL — AB (ref 0.61–1.24)
Calcium: 9.3 mg/dL (ref 8.9–10.3)
GFR, EST AFRICAN AMERICAN: 6 mL/min — AB (ref >60)
GFR, EST NON AFRICAN AMERICAN: 5 mL/min — AB (ref >60)
Glucose, Bld: 106 mg/dL — ABNORMAL HIGH (ref 70–99)
Potassium: 5.1 mmol/L (ref 3.5–5.1)
Sodium: 141 mmol/L (ref 135–145)
Total Bilirubin: 0.8 mg/dL (ref 0.3–1.2)
Total Protein: 6.4 g/dL — ABNORMAL LOW (ref 6.5–8.1)

## 2018-07-08 LAB — POCT GLYCOSYLATED HEMOGLOBIN (HGB A1C): HEMOGLOBIN A1C: 5.2 % (ref 4.0–5.6)

## 2018-07-08 MED ORDER — CALCIUM CARBONATE 1250 (500 CA) MG PO TABS
1250.0000 mg | ORAL_TABLET | Freq: Two times a day (BID) | ORAL | Status: DC
  Administered 2018-07-09: 1250 mg via ORAL
  Filled 2018-07-08 (×2): qty 1

## 2018-07-08 MED ORDER — ASPIRIN EC 81 MG PO TBEC
81.0000 mg | DELAYED_RELEASE_TABLET | Freq: Every day | ORAL | Status: DC
  Administered 2018-07-09: 81 mg via ORAL
  Filled 2018-07-08: qty 1

## 2018-07-08 MED ORDER — PANTOPRAZOLE SODIUM 40 MG PO TBEC
40.0000 mg | DELAYED_RELEASE_TABLET | Freq: Every day | ORAL | Status: DC
  Administered 2018-07-09 – 2018-07-13 (×3): 40 mg via ORAL
  Filled 2018-07-08 (×3): qty 1

## 2018-07-08 MED ORDER — ONDANSETRON HCL 4 MG PO TABS: 4.0000 mg | ORAL_TABLET | Freq: Four times a day (QID) | ORAL | Status: DC | PRN

## 2018-07-08 MED ORDER — ACETAMINOPHEN 650 MG RE SUPP: 650.0000 mg | Freq: Four times a day (QID) | RECTAL | Status: DC | PRN

## 2018-07-08 MED ORDER — ISOSORBIDE MONONITRATE ER 60 MG PO TB24
60.0000 mg | ORAL_TABLET | Freq: Every day | ORAL | Status: DC
  Administered 2018-07-09 – 2018-07-13 (×4): 60 mg via ORAL
  Filled 2018-07-08 (×4): qty 1

## 2018-07-08 MED ORDER — CARVEDILOL 25 MG PO TABS
25.0000 mg | ORAL_TABLET | Freq: Two times a day (BID) | ORAL | Status: DC
  Administered 2018-07-09 – 2018-07-10 (×3): 25 mg via ORAL
  Filled 2018-07-08 (×3): qty 1

## 2018-07-08 MED ORDER — METRONIDAZOLE IN NACL 5-0.79 MG/ML-% IV SOLN
500.0000 mg | Freq: Three times a day (TID) | INTRAVENOUS | Status: DC
  Administered 2018-07-08 – 2018-07-12 (×9): 500 mg via INTRAVENOUS
  Filled 2018-07-08 (×9): qty 100

## 2018-07-08 MED ORDER — CLONIDINE HCL 0.1 MG PO TABS
0.1000 mg | ORAL_TABLET | Freq: Two times a day (BID) | ORAL | Status: DC
  Administered 2018-07-09 – 2018-07-10 (×4): 0.1 mg via ORAL
  Filled 2018-07-08 (×5): qty 1

## 2018-07-08 MED ORDER — SODIUM CHLORIDE 0.9 % IV SOLN
2.0000 g | INTRAVENOUS | Status: DC
  Administered 2018-07-09 – 2018-07-11 (×4): 2 g via INTRAVENOUS
  Filled 2018-07-08 (×4): qty 20

## 2018-07-08 MED ORDER — AMLODIPINE BESYLATE 10 MG PO TABS
10.0000 mg | ORAL_TABLET | Freq: Every day | ORAL | Status: DC
  Administered 2018-07-09 – 2018-07-10 (×2): 10 mg via ORAL
  Filled 2018-07-08 (×2): qty 1

## 2018-07-08 MED ORDER — ACETAMINOPHEN 325 MG PO TABS
650.0000 mg | ORAL_TABLET | Freq: Four times a day (QID) | ORAL | Status: DC | PRN
  Administered 2018-07-09 – 2018-07-11 (×2): 650 mg via ORAL
  Filled 2018-07-08 (×2): qty 2

## 2018-07-08 MED ORDER — HYDRALAZINE HCL 25 MG PO TABS
37.5000 mg | ORAL_TABLET | Freq: Three times a day (TID) | ORAL | Status: DC
  Administered 2018-07-08 – 2018-07-10 (×4): 37.5 mg via ORAL
  Filled 2018-07-08 (×6): qty 2

## 2018-07-08 MED ORDER — INSULIN ASPART 100 UNIT/ML ~~LOC~~ SOLN: 0.0000 [IU] | Freq: Every day | SUBCUTANEOUS | Status: DC

## 2018-07-08 MED ORDER — ONDANSETRON HCL 4 MG/2ML IJ SOLN
4.0000 mg | Freq: Four times a day (QID) | INTRAMUSCULAR | Status: DC | PRN
  Filled 2018-07-08: qty 2

## 2018-07-08 MED ORDER — INSULIN ASPART 100 UNIT/ML ~~LOC~~ SOLN
0.0000 [IU] | Freq: Three times a day (TID) | SUBCUTANEOUS | Status: DC
  Administered 2018-07-12 (×2): 2 [IU] via SUBCUTANEOUS

## 2018-07-08 MED ORDER — HEPARIN SODIUM (PORCINE) 5000 UNIT/ML IJ SOLN
5000.0000 [IU] | Freq: Three times a day (TID) | INTRAMUSCULAR | Status: DC
  Administered 2018-07-09 – 2018-07-10 (×4): 5000 [IU] via SUBCUTANEOUS
  Filled 2018-07-08 (×5): qty 1

## 2018-07-08 NOTE — H&P (Signed)
Date: 07/08/2018               Patient Name:  Roger Rollins MRN: [id number]  DOB: 1960/11/09 Age / Sex: 57 y.o., male   PCP: Roger Courser, MD         Medical Service: Internal Medicine Teaching Service         Attending Physician: Dr. Earl Lagos, MD    First Contact: Dr. Lenward Rollins Pager: [phone]  Second Contact: Dr. Levora Rollins Pager: [phone]       After Hours (After 5p/  First Contact Pager: [phone]  weekends / holidays): Second Contact Pager: [phone]   Chief Complaint: Abdominal Pain  History of Present Illness: Roger Rollins is a very pleasant 57 y/o male with PMHx of DM, HTN, HF, GERD, and ESRD on Tu/Thurs/Sat dialysis who presents as a direct admit from clinic for abdominal pain. Patient reports abdominal pain beginning yesterday evening which is most intense in the epigastric region, but present throughout the entire abdomen. He describes the pain as constant and stabbing in nature. Currently rates the pain as a 7/10, states it was 10/10 yesterday evening. No exacerbating or alleviating symptoms, has not taken anything for the pain. Associated symptoms include nausea and vomiting, with clear emesis, he has not been able to tolerate PO over the last 24 hrs. Pt does report morning nausea and vomiting at baseline ever since starting dialysis 5-6 years ago, but states that this is different. Denies fevers, chills, myalgias, fatigue, constipation or diarrhea (had two normal bowel movements today). No recent sick contacts or unusual food exposures (always eats at home).  Of note, he also endorses significant fluctuations in BP with recent systolic readings up to 210. Pt denies headaches, dizziness, or changes in vision.    Meds:    Current Outpatient Medications on File Prior to Encounter  Medication Sig Dispense Refill  . acetaminophen (TYLENOL 8 HOUR) 650 MG CR tablet Take 1 tablet (650 mg total) by mouth every 8 (eight) hours as needed for pain.  30 tablet 0  . amLODipine (NORVASC) 10 MG tablet Take 1 tablet (10 mg total) by mouth daily. IM Program. 90 tablet 3  . aspirin EC 81 MG tablet Take 81 mg by mouth daily.    . calcium carbonate (OS-CAL) 1250 (500 Ca) MG chewable tablet Chew 1 tablet by mouth 2 (two) times daily.    . carvedilol (COREG) 25 MG tablet Take 1 tablet (25 mg total) by mouth 2 (two) times daily with a meal. IM Program. 60 tablet 11  . cloNIDine (CATAPRES) 0.1 MG tablet Take 1 tablet (0.1 mg total) by mouth 2 (two) times daily. 60 tablet 11  . ferric citrate (AURYXIA) 1 GM 210 MG(Fe) tablet Take 2 tablets (420 mg total) by mouth 3 (three) times daily with meals. 270 tablet 0  . hydrALAZINE (APRESOLINE) 25 MG tablet Take 1.5 tablets (37.5 mg total) by mouth 3 (three) times daily. 135 tablet 11  . Iron Polysacch Cmplx-B12-FA 150-0.025-1 MG TABS Take 150 mg by mouth 2 (two) times daily at 10 AM and 5 PM.    . isosorbide mononitrate (IMDUR) 60 MG 24 hr tablet Take 1 tablet (60 mg total) by mouth every morning. 30 tablet 11  . Lido-Capsaicin-Men-Methyl Sal 0.5-0.035-5-20 % PTCH Apply 1 patch topically 3 (three) times daily as needed. 30 patch 0  . omeprazole (PRILOSEC) 20 MG capsule Take 1 capsule (20 mg total) by mouth 2 (two) times daily before a meal.  IM Program. 60 capsule 2  . sucralfate (CARAFATE) 1 GM/10ML suspension Take 1 g by mouth 4 (four) times daily -  with meals and at bedtime.        Allergies: Allergies as of 07/08/2018  . (No Known Allergies)   Past Medical History:  Diagnosis Date  . Diabetes mellitus without complication (HCC)   . GERD (gastroesophageal reflux disease)   . Heart failure (HCC)   . Hypertension   . Renal disorder     Family History:  Mother: + Pancreatic Cancer, dx in her early 4960s Father: + Cancer, unclear etiology   Social History: Pt lives at home with his daughter, son-in-law, and grandchild. He is not currently working, but helps to look after his grandson. Pt reports that  he is fairly active at baseline, and works out everyday. He denies any alcohol, tobacco, or drug use.  Review of Systems: A complete ROS was negative except as per HPI.   Physical Exam: Vitals:  Vitals:   07/08/18 1848  BP: (!) 151/71  Pulse: 69  Resp: 18  Temp: 97.8 F (36.6 C)  SpO2: 95%    General: Well-developed, well-nourished male lying on exam table, NAD, answers questions appropriately HEENT: NCAT, sclerae anicteric, dry mucous membranes, posterior oropharynx without erythema or edema  Neck: No palpable lymphadenopathy, normal  CV: Regular rate and rhythm, 3/6 holosystolic murmur heard best over upper sternal borders Pulm: Clear to ascultation bilaterally, no increased WOB, no accessory muscle use Abdomen: Appears uncomfortable throughout abdominal exam, soft, non-distended, tenderness to palpation in the RUQ, epigastric region, and LUQ, +Murphy's sign, no rebound or guarding, BS WNL,  Ext: Fistula on upper left extremity, lower extremities without edema or erythema Psych: Appropriate mood and affect   Assessment & Plan by Problem:  [name] is a very pleasant 57 y/o male with PMHx of DM, HTN, HF, GERD, and ESRD on Tu/Thurs/Sat dialysis who presents as a direct admit from clinic for abdominal pain.    Active Problems:   Abdominal pain  Abdominal Pain: 1 day of moderate-severe constant abdominal pain that is unassociated with eating. Exam c/f cholecystitis vs cholelithiasis with + Murphy's sign.  Lipase mildly elevated at 64, LFTS WNL. CMP with elevated BUN/Creatinine, and low protein, however pt with ESRD and these values appear to be stable. CBC without leukocytosis, but revealing a drop in hemoglobin to 8.6 from baseline of ~11-12. Normocytic anemia could be c/w blood loss from underlying peptic ulcer disease vs. anemia of chronic disease. Pt does have PMHx of GERD but denies NSAID or EtOH use, would consider EGD for further workup if abdominal u/s is unrevealing.  [name] also reports a positive history of pancreatic cancer in his mother, so this could be a consideration if etiology remains unclear after initial workup.   - Acetaminophen 650 mg tablet q6h PRN  - Abdominal U/S to rule out acute gallbladder disease - Consider EGD to screen for gastric etiology if U/S unrevealing  Nausea/Vomiting: Pt with AM n/v at baseline, reports increased n/v over the past 24 hours with clear emesis. Unable to tolerate PO. Will treat nausea with anti-emetics. -Ondansteron 4 mg tablet q6h PRN, can use 4 mg injection if pt unable to tolerate PO  ESRD: on HD Tues/Thurs/Sat schedule,  - consult nephrology for inpatient HD management  - Tues/Thurs/Sat Dialysis - Avoid nephrotoxic medications - Renal Function panel  Diabetes: well controlled; not currently on any medications outpatient  - SSI as inpatient   HFpEF: Chronic,  stable. - Carvedilol 25 mg daily  HTN: Chronic, but reports recent fluctuations in BP which could be related to progressive renal disease. Pt currently asymptomatic. Will continue to monitor. - Amlodipine 10 mg daily - Clonidine 0.1mg  daily - Hydralazine 25 mg 1.5 tablets (37.5mg ) TID  Dispo: Admit patient to Inpatient with expected length of stay greater than 2 midnights.  Signed: Kerby Moors, Medical Student 07/08/2018, 5:37 PM  Pager: [phone]

## 2018-07-08 NOTE — Progress Notes (Signed)
   CC: Stomach pain with nausea and vomiting.  HPI:  [name] is a 57 y.o. with past medical history as listed below came to the clinic with complaint of stomach pain with increased bloatedness, nausea and vomiting since yesterday. Patient was unable to eat anything since this morning because of nausea and vomiting.  He vomited 2 nonbilious, nonbloody vomits since morning.  He did took his home meds but vomited soon after so not sure whether he was able to keep them are not. Patient denies eating from outside or any fried food. Denies similar symptoms in the past. Denies any fever or chills. Denies any sick contacts. He has 2 normal bowel movements since this morning and denies any diarrhea. He is compliant with dialysis. He was also complaining of increased urinary urgency and frequency, denies any lower abdominal pain, dysuria or hematuria.  Please see assessment and plan for his chronic conditions.  Past Medical History:  Diagnosis Date  . Diabetes mellitus without complication (HCC)   . GERD (gastroesophageal reflux disease)   . Heart failure (HCC)   . Hypertension   . Renal disorder    Review of Systems: Negative except mentioned in HPI.  Physical Exam:  Vitals:   07/08/18 1537  BP: (!) 173/66  Pulse: 64  Temp: 97.7 F (36.5 C)  TempSrc: Oral  SpO2: 98%  Weight: 160 lb 4.8 oz (72.7 kg)  Height: 5' 9.5" (1.765 m)    General: Vital signs reviewed.  Patient is well-developed and well-nourished, in no acute distress and cooperative with exam.  Head: Normocephalic and atraumatic. Eyes: EOMI, conjunctivae normal, no scleral icterus.  Cardiovascular: RRR, loud murmur most likely from his left upper extremity fistula. Pulmonary/Chest: Clear to auscultation bilaterally, no wheezes, rales, or rhonchi. Abdominal: Soft, tenderness along epigastrium and right upper quadrant with positive Murphy, non distended, no guarding. Extremities: No lower extremity edema  bilaterally,  pulses symmetric and intact bilaterally.  Neurological: A&O x3, Strength is normal and symmetric bilaterally, cranial nerve II-XII are grossly intact, no focal motor deficit, sensory intact to light touch bilaterally.  Skin: Warm, dry and intact. No rashes or erythema. Psychiatric: Normal mood and affect. speech and behavior is normal. Cognition and memory are normal.  Assessment & Plan:   See Encounters Tab for problem based charting.  Patient discussed with Dr. Criselda PeachesMullen.

## 2018-07-08 NOTE — Addendum Note (Signed)
Addended by: Bufford SpikesFULCHER, Tausha Milhoan N on: 07/08/2018 04:59 PM   Modules accepted: Orders

## 2018-07-08 NOTE — Assessment & Plan Note (Signed)
He has diet-controlled diabetes with A1c of 5.2 today.  Continue monitoring A1c every 7530-month.

## 2018-07-08 NOTE — Assessment & Plan Note (Signed)
He is compliant with his dialysis schedule of Tuesday, Thursday and Saturday.

## 2018-07-08 NOTE — Assessment & Plan Note (Signed)
BP Readings from Last 3 Encounters:  07/08/18 (!) 173/66  04/02/18 (!) 174/71  02/18/18 (!) 142/68   He was having elevated blood pressure today. He was having 10/10 epigastric pain and did vomited this morning after taking his morning meds.  Patient was admitted for his acute right upper quadrant pain. I will not make any changes to his current regimen at this time.

## 2018-07-08 NOTE — Progress Notes (Signed)
Report was called to Ileene RubensAnisha, RN 5 MW.  Patient alert oriented.  NS lock in right hand.  Patient transported via wheelchair to 5 MW Bed 14.  Angelina OkGladys Neilani Duffee, RN 07/08/2018 5:16 PM

## 2018-07-08 NOTE — Progress Notes (Signed)
Called Pharmacy earlier regarding patient meds not verified.

## 2018-07-08 NOTE — Assessment & Plan Note (Signed)
Patient symptoms and exam was concerning for symptomatic cholelithiasis.  POCUS was done with Dr. Mikey BussingHoffman which shows distended gallbladder and with some possible shadowing.  We will check stat CBC, CMP, and lipase levels.  Patient was admitted for pain control, fluids and RUQ ultrasound for diagnosis and management of his acute pain.

## 2018-07-08 NOTE — Assessment & Plan Note (Signed)
He denies any exertional dyspnea or lower extremity edema. Compliant with his dialysis.  Continue taking Coreg, Imdur and aspirin.

## 2018-07-08 NOTE — Progress Notes (Signed)
Updated MD regarding diet.

## 2018-07-09 DIAGNOSIS — K810 Acute cholecystitis: Secondary | ICD-10-CM

## 2018-07-09 LAB — RENAL FUNCTION PANEL
ALBUMIN: 3.3 g/dL — AB (ref 3.5–5.0)
Anion gap: 16 — ABNORMAL HIGH (ref 5–15)
BUN: 80 mg/dL — AB (ref 6–20)
CO2: 22 mmol/L (ref 22–32)
Calcium: 8.9 mg/dL (ref 8.9–10.3)
Chloride: 102 mmol/L (ref 98–111)
Creatinine, Ser: 10.74 mg/dL — ABNORMAL HIGH (ref 0.61–1.24)
GFR, EST AFRICAN AMERICAN: 5 mL/min — AB (ref >60)
GFR, EST NON AFRICAN AMERICAN: 5 mL/min — AB (ref >60)
Glucose, Bld: 82 mg/dL (ref 70–99)
PHOSPHORUS: 6.3 mg/dL — AB (ref 2.5–4.6)
Potassium: 5.2 mmol/L — ABNORMAL HIGH (ref 3.5–5.1)
Sodium: 140 mmol/L (ref 135–145)

## 2018-07-09 LAB — CBC WITH DIFFERENTIAL/PLATELET
ABS IMMATURE GRANULOCYTES: 0.03 10*3/uL (ref 0.00–0.07)
Basophils Absolute: 0 10*3/uL (ref 0.0–0.1)
Basophils Relative: 1 %
Eosinophils Absolute: 0.1 10*3/uL (ref 0.0–0.5)
Eosinophils Relative: 1 %
HEMATOCRIT: 26 % — AB (ref 39.0–52.0)
Hemoglobin: 8.4 g/dL — ABNORMAL LOW (ref 13.0–17.0)
IMMATURE GRANULOCYTES: 1 %
LYMPHS ABS: 0.7 10*3/uL (ref 0.7–4.0)
Lymphocytes Relative: 11 %
MCH: 31.2 pg (ref 26.0–34.0)
MCHC: 32.3 g/dL (ref 30.0–36.0)
MCV: 96.7 fL (ref 80.0–100.0)
Monocytes Absolute: 0.4 10*3/uL (ref 0.1–1.0)
Monocytes Relative: 7 %
NEUTROS ABS: 4.9 10*3/uL (ref 1.7–7.7)
Neutrophils Relative %: 79 %
PLATELETS: 135 10*3/uL — AB (ref 150–400)
RBC: 2.69 MIL/uL — ABNORMAL LOW (ref 4.22–5.81)
RDW: 14.8 % (ref 11.5–15.5)
WBC: 6.2 10*3/uL (ref 4.0–10.5)
nRBC: 0 % (ref 0.0–0.2)

## 2018-07-09 LAB — GLUCOSE, CAPILLARY
GLUCOSE-CAPILLARY: 88 mg/dL (ref 70–99)
Glucose-Capillary: 177 mg/dL — ABNORMAL HIGH (ref 70–99)
Glucose-Capillary: 75 mg/dL (ref 70–99)
Glucose-Capillary: 86 mg/dL (ref 70–99)
Glucose-Capillary: 87 mg/dL (ref 70–99)

## 2018-07-09 LAB — HEPATITIS B SURFACE ANTIGEN: HEP B S AG: NEGATIVE

## 2018-07-09 LAB — MRSA PCR SCREENING: MRSA BY PCR: NEGATIVE

## 2018-07-09 MED ORDER — SODIUM CHLORIDE 0.9 % IV SOLN
62.5000 mg | INTRAVENOUS | Status: DC
  Filled 2018-07-09: qty 5

## 2018-07-09 MED ORDER — CALCITRIOL 0.5 MCG PO CAPS
ORAL_CAPSULE | ORAL | Status: AC
  Administered 2018-07-09: 2.5 ug via ORAL
  Filled 2018-07-09: qty 5

## 2018-07-09 MED ORDER — DARBEPOETIN ALFA 150 MCG/0.3ML IJ SOSY: 150.0000 ug | PREFILLED_SYRINGE | INTRAMUSCULAR | Status: DC

## 2018-07-09 MED ORDER — CALCITRIOL 0.25 MCG PO CAPS
2.5000 ug | ORAL_CAPSULE | ORAL | Status: DC
  Administered 2018-07-09 – 2018-07-13 (×2): 2.5 ug via ORAL
  Filled 2018-07-09: qty 10

## 2018-07-09 NOTE — Progress Notes (Addendum)
   Subjective: No acute events overnight. Patient reports that his abdominal pain eased off yesterday evening and he was able to sleep well, but returned this morning around 8:30 AM after he had a bowel movement. He endorses some mild nausea this AM, but no further emesis. Continues to have normal BMs. Pt does not express any other concerns at this time.  Objective:  Vital signs in last 24 hours: Vitals:   07/08/18 2107 07/08/18 2233 07/09/18 0523 07/09/18 0719  BP:  (!) 180/81 (!) 177/73 (!) 175/73  Pulse: 66 70 65 64  Resp: 18  18 19   Temp: 97.9 F (36.6 C)  98 F (36.7 C)   TempSrc: Oral  Oral   SpO2: 95%  94% 96%   Gen: Well-developed, well-nourished adult male lying in bed, alert, NAD, answers questions appropriately CV: Regular rate and rhythm, 3/6 holosystolic murmur heard best at Roger upper sternal borders Pulm: CTAB, no increased WOB, no accessory muscle use Abdomen: Soft, non-distended, epigastric and RUQ tenderness to palpation, + Murphy's sign, BS WNL Extremities: Left upper arm fistula, no pedal edema or erythema bilaterally.  Assessment/Plan: [name] is a very pleasant 57 y/o male with PMHx of DM, HTN, HF, GERD, and ESRD on Tu/Thurs/Sat dialysis who presents as a direct admit from clinic for abdominal pain with U/S findings c/w cholecystitis.    Active Problems:   Abdominal pain  Acute Cholecystitis confirmed on RUQ U/S: LFTs and lipase normal. No fever or leukocytosis. Have initiated ceftriaxone for empiric therapy. General surgery on board and plan for lap cholecystectomy tomorrow  - NPO @ midnight  - Acetaminophen 650 mg tablet q6h PRN    Nausea/Vomiting: Improved; no emesis since admission.  -Ondansteron 4 mg tablet q6h PRN, can use 4 mg injection if pt unable to tolerate PO  ESRD: on HD Tues/Thurs/Sat schedule.  - Appreciate Nephrology following for inpatient HD management; had HD session today  Diabetes: well controlled; not currently on any  medications outpatient  - SSI as inpatient   Normocytic Anemia: CBC revealing a drop in hemoglobin to 8.6 from baseline of ~11-12 one year ago. Normocytic anemia likely due to anemia of chronic disease/ESRD. Getting ESA and weekly IV Fe with HD. Colonoscopy within last 2-3 years. No acute intervention warranted, follow up in outpatient setting.  HFpEF: Chronic, stable. - Carvedilol 25 mg daily  HTN: Chronic, but reports recent fluctuations in BP which could be related to progressive renal disease. Pt currently asymptomatic. Will continue to monitor. - Amlodipine 10 mg daily - Clonidine 0.1mg  daily - Hydralazine 25 mg 1.5 tablets (37.5mg ) TID  Dispo: Anticipated discharge in approximately 2-3 day(s).   Kerby MoorsStoneking, Shelby E, Medical Student 07/09/2018, 7:43 AM Pager: [phone]

## 2018-07-09 NOTE — Progress Notes (Signed)
Patient seen and examined on Hemodialysis. QB400 mL/ min via AVF UF goal 3L.  Per pt, cholecystectomy tomorrow.    Treatment adjusted as needed.  Bufford ButtnerElizabeth Lateesha Bezold MD [facility] pgr [phone] 12:58 PM

## 2018-07-09 NOTE — Consult Note (Addendum)
[facility] Surgery Consult Note  Roger Rollins 21-Mar-1961  [id number].    Requesting MD: Dareen Piano, MD Chief Complaint/Reason for Consult: RUQ pain HPI:  Roger Rollins is a pleasant 57 year old male with a past medical history of diabetes, hypertension, heart failure, GERD, and ESRD on HD who presented to [facility] from an outpatient clinic with a chief complaint of upper abdominal pain.  Patient reports acute onset of upper abdominal pain 11/16 described as stabbing, nonradiating.  Pain started after eating pork with red salsa for dinner. Denies post-prandial pain in the past. He states the pain got a little bit better the following day, but returned on 11/18 and remained constant throughout the day so he went to the doctor for further evaluation.  He was admitted to the hospital with concerns for a calculus cholecystitis.  Patient reports a history of upper abdominal pain about 5 years ago in [place] where upper endoscopy revealed "inflammation" and "yeast", negative for PUD. The patient was started on omeprazole and sulcralfate. He states his grandmother had gallstones requiring cholecystectomy and improving her abdominal pain. Patient denies tobacco or alcohol use. NKDA. Denies previous complications under general anesthesia. Denies history of abdominal surgery.   ROS: Review of Systems  Constitutional: Negative for chills and fever.  Gastrointestinal: Positive for abdominal pain, constipation (constipation at baseline), nausea and vomiting. Negative for blood in stool, diarrhea and melena.  All other systems reviewed and are negative.   Family History  Problem Relation Age of Onset  . Diabetes type II Mother   . Heart disease Mother   . Diabetes type II Father     Past Medical History:  Diagnosis Date  . Diabetes mellitus without complication ([place])   . GERD (gastroesophageal reflux disease)   . Heart failure ([place])   . Hypertension   . Renal disorder      Past Surgical History:  Procedure Laterality Date  . AV FISTULA PLACEMENT Left 06/2015    Social History:  reports that he has never smoked. He has never used smokeless tobacco. He reports that he does not drink alcohol or use drugs.  Allergies:  Allergies  Allergen Reactions  . Onion Nausea And Vomiting, Other (See Comments) and Hypertension    Migraines, also    Medications Prior to Admission  Medication Sig Dispense Refill  . acetaminophen (TYLENOL 8 HOUR) 650 MG CR tablet Take 1 tablet (650 mg total) by mouth every 8 (eight) hours as needed for pain. 30 tablet 0  . amLODipine (NORVASC) 10 MG tablet Take 1 tablet (10 mg total) by mouth daily. IM Program. 90 tablet 3  . aspirin EC 81 MG tablet Take 81 mg by mouth daily.    . calcium carbonate (OS-CAL) 1250 (500 Ca) MG chewable tablet Chew 1 tablet by mouth 2 (two) times daily.    . carvedilol (COREG) 25 MG tablet Take 1 tablet (25 mg total) by mouth 2 (two) times daily with a meal. IM Program. 60 tablet 11  . cloNIDine (CATAPRES) 0.1 MG tablet Take 1 tablet (0.1 mg total) by mouth 2 (two) times daily. 60 tablet 11  . ferric citrate (AURYXIA) 1 GM 210 MG(Fe) tablet Take 2 tablets (420 mg total) by mouth 3 (three) times daily with meals. 270 tablet 0  . hydrALAZINE (APRESOLINE) 25 MG tablet Take 1.5 tablets (37.5 mg total) by mouth 3 (three) times daily. 135 tablet 11  . isosorbide mononitrate (IMDUR) 60 MG 24 hr tablet Take 1 tablet (60 mg total) by  mouth every morning. 30 tablet 11  . Lido-Capsaicin-Men-Methyl Sal 0.5-0.035-5-20 % PTCH Apply 1 patch topically 3 (three) times daily as needed. (Patient taking differently: Apply 1 patch topically 3 (three) times daily as needed (for pain). ) 30 patch 0  . omeprazole (PRILOSEC) 20 MG capsule Take 1 capsule (20 mg total) by mouth 2 (two) times daily before a meal. IM Program. 60 capsule 2    Blood pressure (!) 175/73, pulse 64, temperature 98 F (36.7 C), temperature source Oral,  resp. rate 19, SpO2 96 %. Physical Exam: Physical Exam  Constitutional: He is oriented to person, place, and time. He appears well-developed and well-nourished. No distress.  HENT:  Head: Normocephalic and atraumatic.  Right Ear: External ear normal.  Left Ear: External ear normal.  Nose: Nose normal.  Eyes: Pupils are equal, round, and reactive to light. Conjunctivae and EOM are normal. Right eye exhibits no discharge. Left eye exhibits no discharge.  Neck: Normal range of motion. Neck supple. No tracheal deviation present. No thyromegaly present.  Cardiovascular: Normal rate, regular rhythm and intact distal pulses. Exam reveals no friction rub.  No murmur heard. Pulmonary/Chest: Effort normal and breath sounds normal. No stridor. No respiratory distress. He has no wheezes.  Abdominal: Soft. Bowel sounds are normal. He exhibits no distension. There is tenderness.  Epigastric and RUQ tenderness with +Murphy's  Musculoskeletal: Normal range of motion. He exhibits no edema or deformity.  Neurological: He is alert and oriented to person, place, and time. No sensory deficit.  Skin: Skin is warm and dry. No rash noted. He is not diaphoretic.  Psychiatric: He has a normal mood and affect. His behavior is normal. Thought content normal.    Results for orders placed or performed during the hospital encounter of 07/08/18 (from the past 48 hour(s))  Glucose, capillary     Status: Abnormal   Collection Time: 07/08/18  6:50 PM  Result Value Ref Range   Glucose-Capillary 126 (H) 70 - 99 mg/dL  Glucose, capillary     Status: Abnormal   Collection Time: 07/08/18  9:03 PM  Result Value Ref Range   Glucose-Capillary 107 (H) 70 - 99 mg/dL  MRSA PCR Screening     Status: None   Collection Time: 07/08/18 10:30 PM  Result Value Ref Range   MRSA by PCR NEGATIVE NEGATIVE    Comment:        The GeneXpert MRSA Assay (FDA approved for NASAL specimens only), is one component of a comprehensive MRSA  colonization surveillance program. It is not intended to diagnose MRSA infection nor to guide or monitor treatment for MRSA infections. Performed at [facility], [place] [address] [place], [place] [address]   Glucose, capillary     Status: None   Collection Time: 07/09/18 12:15 AM  Result Value Ref Range   Glucose-Capillary 87 70 - 99 mg/dL  Renal function panel     Status: Abnormal   Collection Time: 07/09/18  4:50 AM  Result Value Ref Range   Sodium 140 135 - 145 mmol/L   Potassium 5.2 (H) 3.5 - 5.1 mmol/L   Chloride 102 98 - 111 mmol/L   CO2 22 22 - 32 mmol/L   Glucose, Bld 82 70 - 99 mg/dL   BUN 80 (H) 6 - 20 mg/dL   Creatinine, Ser 10.74 (H) 0.61 - 1.24 mg/dL   Calcium 8.9 8.9 - 10.3 mg/dL   Phosphorus 6.3 (H) 2.5 - 4.6 mg/dL   Albumin 3.3 (L) 3.5 -  5.0 g/dL   GFR calc non Af Amer 5 (L) >60 mL/min   GFR calc Af Amer 5 (L) >60 mL/min    Comment: (NOTE) The eGFR has been calculated using the CKD EPI equation. This calculation has not been validated in all clinical situations. eGFR's persistently <60 mL/min signify possible Chronic Kidney Disease.    Anion gap 16 (H) 5 - 15    Comment: Performed at [facility], [place] [address], [place], [place] [address]  Glucose, capillary     Status: None   Collection Time: 07/09/18  7:18 AM  Result Value Ref Range   Glucose-Capillary 86 70 - 99 mg/dL  CBC with Differential/Platelet     Status: Abnormal   Collection Time: 07/09/18  7:34 AM  Result Value Ref Range   WBC 6.2 4.0 - 10.5 K/uL   RBC 2.69 (L) 4.22 - 5.81 MIL/uL   Hemoglobin 8.4 (L) 13.0 - 17.0 g/dL   HCT 26.0 (L) 39.0 - 52.0 %   MCV 96.7 80.0 - 100.0 fL   MCH 31.2 26.0 - 34.0 pg   MCHC 32.3 30.0 - 36.0 g/dL   RDW 14.8 11.5 - 15.5 %   Platelets 135 (L) 150 - 400 K/uL   nRBC 0.0 0.0 - 0.2 %   Neutrophils Relative % 79 %   Neutro Abs 4.9 1.7 - 7.7 K/uL   Lymphocytes Relative 11 %   Lymphs Abs 0.7 0.7 - 4.0 K/uL   Monocytes Relative 7 %   Monocytes  Absolute 0.4 0.1 - 1.0 K/uL   Eosinophils Relative 1 %   Eosinophils Absolute 0.1 0.0 - 0.5 K/uL   Basophils Relative 1 %   Basophils Absolute 0.0 0.0 - 0.1 K/uL   Immature Granulocytes 1 %   Abs Immature Granulocytes 0.03 0.00 - 0.07 K/uL    Comment: Performed at [facility], [address] [place], [place] [address]   US Abdomen Limited Ruq  Result Date: 07/08/2018 CLINICAL DATA:  Right upper quadrant pain. Hypertension and diabetes. EXAM: ULTRASOUND ABDOMEN LIMITED RIGHT UPPER QUADRANT COMPARISON:  None. FINDINGS: Gallbladder: No gallstones identified. There is diffuse gallbladder wall edema which measures 7.5 mm in thickness. Pericholecystic fluid noted. Common bile duct: Diameter: 5.5 mm. Liver: No focal lesion identified. Within normal limits in parenchymal echogenicity. Intrahepatic biliary ductal dilatation noted. Portal vein is patent on color Doppler imaging with normal direction of blood flow towards the liver. IMPRESSION: 1. Diffuse gallbladder wall edema with pericholecystic fluid compatible with cholecystitis. 2. Intrahepatic bile duct dilatation with normal caliber common bile duct. Electronically Signed   By: Kerby Moors M.D.   On: 07/08/2018 20:37   Assessment/Plan ESRD on HD Tues/Thurs/Sat  DM HFpEF HTN nausea/vomiting  RUQ abdominal pain with concern for acalculous cholecystitis -  Afebrile, VSS, no leukocytosis, LFTs normal -  RUQ u/s with 7.5 mm wall thickening, pericholecystic fluid, intrahepatic biliary ductal dilatation.  -  Patient with significant RUQ pain, +murphys on exam, pain and N/V consistent with acute cholecystitis.  - HD today, NPO after MN for lap chole tomorrow  - agree with empiric IV Rocephin    Jill Alexanders, [facility] Surgery 07/09/2018, 10:53 AM Pager: [phone] Consults: [phone]

## 2018-07-09 NOTE — Consult Note (Signed)
[facility] Renal Consultation Note  Indication for Consultation:  Management of ESRD/hemodialysis; anemia, hypertension/volume and secondary hyperparathyroidism  HPI: Roger Rollins is a 57 y.o. male with  ESRD due to DM/HTN, started HD in 08/2014 in [place]. Moved to [place] in 06/2017.( [facility]  TTS ) HO Type 2 DM, HTN, HFpEF, GERD.  Now admitted with abdominal pain with cholecystitis on [place] . He reports pain began Sunday evening progressing  in nature , seen in [facility] med clinic yesterday and sent as direct admit . Reports clear emesis  yesterday ,no fevers chills, sob, cp. He is compliant with hd txs and has recent fluctuations in Bp  With SBP up to 210. He is on amlodipine 10 mg qd , clonidine 0.108m bid , Hydralazine 25 mg 1.5 tab  Tid, Imdur 60 mg q am      Past Medical History:  Diagnosis Date  . Diabetes mellitus without complication ([place]   . GERD (gastroesophageal reflux disease)   . Heart failure ([place]   . Hypertension   . Renal disorder     Past Surgical History:  Procedure Laterality Date  . AV FISTULA PLACEMENT Left 06/2015      Family History  Problem Relation Age of Onset  . Diabetes type II Mother   . Heart disease Mother   . Diabetes type II Father       reports that he has never smoked. He has never used smokeless tobacco. He reports that he does not drink alcohol or use drugs.   Allergies  Allergen Reactions  . Onion Nausea And Vomiting, Other (See Comments) and Hypertension    Migraines, also    Prior to Admission medications   Medication Sig Start Date End Date Taking? Authorizing Provider  acetaminophen (TYLENOL 8 HOUR) 650 MG CR tablet Take 1 tablet (650 mg total) by mouth every 8 (eight) hours as needed for pain. 02/18/18  Yes ALorella Nimrod MD  amLODipine (NORVASC) 10 MG tablet Take 1 tablet (10 mg total) by mouth daily. IM Program. 02/18/18  Yes ALorella Nimrod MD  aspirin EC 81 MG tablet Take 81 mg by mouth daily.   Yes [provider]  calcium carbonate (OS-CAL) 1250 (500 Ca) MG chewable tablet Chew 1 tablet by mouth 2 (two) times daily.   Yes [provider]  carvedilol (COREG) 25 MG tablet Take 1 tablet (25 mg total) by mouth 2 (two) times daily with a meal. IM Program. 02/18/18  Yes ALorella Nimrod MD  cloNIDine (CATAPRES) 0.1 MG tablet Take 1 tablet (0.1 mg total) by mouth 2 (two) times daily. 02/18/18 02/18/19 Yes ALorella Nimrod MD  ferric citrate (AURYXIA) 1 GM 210 MG(Fe) tablet Take 2 tablets (420 mg total) by mouth 3 (three) times daily with meals. 07/17/17  Yes HTawny Asal MD  hydrALAZINE (APRESOLINE) 25 MG tablet Take 1.5 tablets (37.5 mg total) by mouth 3 (three) times daily. 02/18/18 02/18/19 Yes ALorella Nimrod MD  isosorbide mononitrate (IMDUR) 60 MG 24 hr tablet Take 1 tablet (60 mg total) by mouth every morning. 02/18/18 02/18/19 Yes ALorella Nimrod MD  Lido-Capsaicin-Men-Methyl Sal 0.5-0.035-5-20 % PTCH Apply 1 patch topically 3 (three) times daily as needed. Patient taking differently: Apply 1 patch topically 3 (three) times daily as needed (for pain).  02/18/18  Yes ALorella Nimrod MD  omeprazole (PRILOSEC) 20 MG capsule Take 1 capsule (20 mg total) by mouth 2 (two) times daily before a meal. IM Program. 02/18/18  Yes ALorella Nimrod MD  Anti-infectives (From admission, onward)   Start     Dose/Rate Route Frequency Ordered Stop   07/08/18 2315  metroNIDAZOLE (FLAGYL) IVPB 500 mg     500 mg 100 mL/hr over 60 Minutes Intravenous Every 8 hours 07/08/18 2056     07/08/18 2100  cefTRIAXone (ROCEPHIN) 2 g in sodium chloride 0.9 % 100 mL IVPB     2 g 200 mL/hr over 30 Minutes Intravenous Every 24 hours 07/08/18 2056        Results for orders placed or performed during the hospital encounter of 07/08/18 (from the past 48 hour(s))  Glucose, capillary     Status: Abnormal   Collection Time: 07/08/18  6:50 PM  Result Value Ref Range   Glucose-Capillary 126 (H) 70 - 99 mg/dL  Glucose, capillary      Status: Abnormal   Collection Time: 07/08/18  9:03 PM  Result Value Ref Range   Glucose-Capillary 107 (H) 70 - 99 mg/dL  MRSA PCR Screening     Status: None   Collection Time: 07/08/18 10:30 PM  Result Value Ref Range   MRSA by PCR NEGATIVE NEGATIVE    Comment:        The GeneXpert MRSA Assay (FDA approved for NASAL specimens only), is one component of a comprehensive MRSA colonization surveillance program. It is not intended to diagnose MRSA infection nor to guide or monitor treatment for MRSA infections. Performed at [facility], [place] [address], [place], [place] [address]   Glucose, capillary     Status: None   Collection Time: 07/09/18 12:15 AM  Result Value Ref Range   Glucose-Capillary 87 70 - 99 mg/dL  Renal function panel     Status: Abnormal   Collection Time: 07/09/18  4:50 AM  Result Value Ref Range   Sodium 140 135 - 145 mmol/L   Potassium 5.2 (H) 3.5 - 5.1 mmol/L   Chloride 102 98 - 111 mmol/L   CO2 22 22 - 32 mmol/L   Glucose, Bld 82 70 - 99 mg/dL   BUN 80 (H) 6 - 20 mg/dL   Creatinine, Ser 10.74 (H) 0.61 - 1.24 mg/dL   Calcium 8.9 8.9 - 10.3 mg/dL   Phosphorus 6.3 (H) 2.5 - 4.6 mg/dL   Albumin 3.3 (L) 3.5 - 5.0 g/dL   GFR calc non Af Amer 5 (L) >60 mL/min   GFR calc Af Amer 5 (L) >60 mL/min    Comment: (NOTE) The eGFR has been calculated using the CKD EPI equation. This calculation has not been validated in all clinical situations. eGFR's persistently <60 mL/min signify possible Chronic Kidney Disease.    Anion gap 16 (H) 5 - 15    Comment: Performed at [facility], [place] [address], [place], [place] [address]  Glucose, capillary     Status: None   Collection Time: 07/09/18  7:18 AM  Result Value Ref Range   Glucose-Capillary 86 70 - 99 mg/dL  CBC with Differential/Platelet     Status: Abnormal   Collection Time: 07/09/18  7:34 AM  Result Value Ref Range   WBC 6.2 4.0 - 10.5 K/uL   RBC 2.69 (L) 4.22 - 5.81 MIL/uL   Hemoglobin 8.4 (L)  13.0 - 17.0 g/dL   HCT 26.0 (L) 39.0 - 52.0 %   MCV 96.7 80.0 - 100.0 fL   MCH 31.2 26.0 - 34.0 pg   MCHC 32.3 30.0 - 36.0 g/dL   RDW 14.8 11.5 - 15.5 %  Platelets 135 (L) 150 - 400 K/uL   nRBC 0.0 0.0 - 0.2 %   Neutrophils Relative % 79 %   Neutro Abs 4.9 1.7 - 7.7 K/uL   Lymphocytes Relative 11 %   Lymphs Abs 0.7 0.7 - 4.0 K/uL   Monocytes Relative 7 %   Monocytes Absolute 0.4 0.1 - 1.0 K/uL   Eosinophils Relative 1 %   Eosinophils Absolute 0.1 0.0 - 0.5 K/uL   Basophils Relative 1 %   Basophils Absolute 0.0 0.0 - 0.1 K/uL   Immature Granulocytes 1 %   Abs Immature Granulocytes 0.03 0.00 - 0.07 K/uL    Comment: Performed at [facility] [address] [place], [place] [address]    ROS: see hpi  Physical Exam: Vitals:   07/09/18 0523 07/09/18 0719  BP: (!) 177/73 (!) 175/73  Pulse: 65 64  Resp: 18 19  Temp: 98 F (36.7 C) 98 F (36.7 C)  SpO2: 94% 96%     General: alert, wd, wn Male ,NAD ,appropriate  HEENT: [place], Not icteric , MMM Neck: no jvd, supple Heart: RRR 2/6 sem  No r,g  Lungs:CTA  Abdomen: BS pos  Soft ,  ND ,Tenderness right quad.   Extremities: no pedal edema  Skin: no overt rash, warm  dry Neuro: Alert OX3, moves all extrem , no asterixis  Dialysis Access:pos bruit LUA AVF  Dialysis Orders: Center: [facility]   on TTS 4 hr . EDW 69kg HD Bath 2k, 2ca   Heparin NONE. Access LUA AVF     Calcitriol 2.5 mcg po /hd  Mircera 150  q 2wks (last given 07/04/18) hgb 9.0  07/04/18       Venofer  50 mg q weekly hd     Assessment/Plan 1. ESRD -  HD today (TTS schedule)  2. Abd pain with cholecystitis on [place] - wu per Admit team  3. Hypertension/volume  - no excess vol on exam,  bp 175/73 this am continue bp meds for now  , fu trend in hosp. Possible tapering back meds if able sec HD pt. 4. Anemia  - hgb8.4  Next ESA 11/28, weekly iv fe on hd  5. Metabolic bone disease -  Po vit d on hd , binders when po diet ,  6. DM type 2 - per admit   Ernest Haber,  PA-C [place] [phone] 07/09/2018, 8:46 AM

## 2018-07-10 ENCOUNTER — Inpatient Hospital Stay (HOSPITAL_COMMUNITY): Payer: Medicaid Other

## 2018-07-10 ENCOUNTER — Encounter (HOSPITAL_COMMUNITY)
Admission: AD | Disposition: A | Discharge status: Home / Self Care | Payer: Self-pay | Source: Home / Self Care | Attending: Internal Medicine

## 2018-07-10 ENCOUNTER — Inpatient Hospital Stay (HOSPITAL_COMMUNITY): Payer: Medicaid Other | Admitting: Certified Registered Nurse Anesthetist

## 2018-07-10 ENCOUNTER — Encounter (HOSPITAL_COMMUNITY): Payer: Self-pay | Admitting: Surgery

## 2018-07-10 DIAGNOSIS — Z9049 Acquired absence of other specified parts of digestive tract: Secondary | ICD-10-CM

## 2018-07-10 DIAGNOSIS — K8063 Calculus of gallbladder and bile duct with acute cholecystitis with obstruction: Secondary | ICD-10-CM

## 2018-07-10 HISTORY — PX: CHOLECYSTECTOMY: SHX55

## 2018-07-10 LAB — RENAL FUNCTION PANEL
ALBUMIN: 2.6 g/dL — AB (ref 3.5–5.0)
Anion gap: 13 (ref 5–15)
BUN: 37 mg/dL — ABNORMAL HIGH (ref 6–20)
CALCIUM: 8 mg/dL — AB (ref 8.9–10.3)
CO2: 23 mmol/L (ref 22–32)
CREATININE: 7.79 mg/dL — AB (ref 0.61–1.24)
Chloride: 104 mmol/L (ref 98–111)
GFR calc Af Amer: 8 mL/min — ABNORMAL LOW (ref >60)
GFR calc non Af Amer: 7 mL/min — ABNORMAL LOW (ref >60)
GLUCOSE: 146 mg/dL — AB (ref 70–99)
PHOSPHORUS: 8.5 mg/dL — AB (ref 2.5–4.6)
Potassium: 5.1 mmol/L (ref 3.5–5.1)
SODIUM: 140 mmol/L (ref 135–145)

## 2018-07-10 LAB — POCT I-STAT 4, (NA,K, GLUC, HGB,HCT)
Glucose, Bld: 90 mg/dL (ref 70–99)
HCT: 27 % — ABNORMAL LOW (ref 39.0–52.0)
Hemoglobin: 9.2 g/dL — ABNORMAL LOW (ref 13.0–17.0)
Potassium: 4 mmol/L (ref 3.5–5.1)
Sodium: 141 mmol/L (ref 135–145)

## 2018-07-10 LAB — GLUCOSE, CAPILLARY
GLUCOSE-CAPILLARY: 84 mg/dL (ref 70–99)
GLUCOSE-CAPILLARY: 81 mg/dL (ref 70–99)
GLUCOSE-CAPILLARY: 124 mg/dL — AB (ref 70–99)
Glucose-Capillary: 86 mg/dL (ref 70–99)
Glucose-Capillary: 87 mg/dL (ref 70–99)
Glucose-Capillary: 89 mg/dL (ref 70–99)
Glucose-Capillary: 93 mg/dL (ref 70–99)

## 2018-07-10 LAB — CBC
HCT: 20.6 % — ABNORMAL LOW (ref 39.0–52.0)
Hemoglobin: 6.5 g/dL — CL (ref 13.0–17.0)
MCH: 31.6 pg (ref 26.0–34.0)
MCHC: 31.6 g/dL (ref 30.0–36.0)
MCV: 100 fL (ref 80.0–100.0)
PLATELETS: 128 10*3/uL — AB (ref 150–400)
RBC: 2.06 MIL/uL — ABNORMAL LOW (ref 4.22–5.81)
RDW: 14.9 % (ref 11.5–15.5)
WBC: 7.2 10*3/uL (ref 4.0–10.5)
nRBC: 0 % (ref 0.0–0.2)

## 2018-07-10 LAB — SURGICAL PCR SCREEN
MRSA, PCR: NEGATIVE
Staphylococcus aureus: POSITIVE — AB

## 2018-07-10 SURGERY — LAPAROSCOPIC CHOLECYSTECTOMY WITH INTRAOPERATIVE CHOLANGIOGRAM
Anesthesia: General | Site: Abdomen

## 2018-07-10 MED ORDER — SUGAMMADEX SODIUM 200 MG/2ML IV SOLN
INTRAVENOUS | Status: AC
  Filled 2018-07-10: qty 2

## 2018-07-10 MED ORDER — MIDAZOLAM HCL 2 MG/2ML IJ SOLN
INTRAMUSCULAR | Status: AC
  Filled 2018-07-10: qty 2

## 2018-07-10 MED ORDER — ROCURONIUM BROMIDE 50 MG/5ML IV SOSY
PREFILLED_SYRINGE | INTRAVENOUS | Status: AC
  Filled 2018-07-10: qty 5

## 2018-07-10 MED ORDER — MIDAZOLAM HCL 2 MG/2ML IJ SOLN
INTRAMUSCULAR | Status: DC | PRN
  Administered 2018-07-10: 2 mg via INTRAVENOUS

## 2018-07-10 MED ORDER — FENTANYL CITRATE (PF) 100 MCG/2ML IJ SOLN
INTRAMUSCULAR | Status: AC
  Filled 2018-07-10: qty 2

## 2018-07-10 MED ORDER — SODIUM CHLORIDE 0.9 % IR SOLN
Status: DC | PRN
  Administered 2018-07-10: 1

## 2018-07-10 MED ORDER — ROCURONIUM BROMIDE 10 MG/ML (PF) SYRINGE
PREFILLED_SYRINGE | INTRAVENOUS | Status: DC | PRN
  Administered 2018-07-10: 10 mg via INTRAVENOUS
  Administered 2018-07-10: 30 mg via INTRAVENOUS

## 2018-07-10 MED ORDER — SODIUM CHLORIDE 0.9 % IV BOLUS
500.0000 mL | Freq: Once | INTRAVENOUS | Status: AC
  Administered 2018-07-10: 500 mL via INTRAVENOUS

## 2018-07-10 MED ORDER — ONDANSETRON HCL 4 MG/2ML IJ SOLN
INTRAMUSCULAR | Status: AC
  Filled 2018-07-10: qty 2

## 2018-07-10 MED ORDER — CHLORHEXIDINE GLUCONATE CLOTH 2 % EX PADS
6.0000 | MEDICATED_PAD | Freq: Every day | CUTANEOUS | Status: DC
  Administered 2018-07-10 – 2018-07-13 (×2): 6 via TOPICAL

## 2018-07-10 MED ORDER — BUPIVACAINE-EPINEPHRINE 0.25% -1:200000 IJ SOLN
INTRAMUSCULAR | Status: DC | PRN
  Administered 2018-07-10: 11 mL

## 2018-07-10 MED ORDER — LIDOCAINE 2% (20 MG/ML) 5 ML SYRINGE
INTRAMUSCULAR | Status: AC
  Filled 2018-07-10: qty 5

## 2018-07-10 MED ORDER — MORPHINE SULFATE (PF) 2 MG/ML IV SOLN: 2.0000 mg | INTRAVENOUS | Status: DC | PRN

## 2018-07-10 MED ORDER — PROMETHAZINE HCL 25 MG/ML IJ SOLN: 6.2500 mg | INTRAMUSCULAR | Status: DC | PRN

## 2018-07-10 MED ORDER — FENTANYL CITRATE (PF) 100 MCG/2ML IJ SOLN
25.0000 ug | INTRAMUSCULAR | Status: DC | PRN
  Administered 2018-07-10 (×2): 50 ug via INTRAVENOUS

## 2018-07-10 MED ORDER — OXYCODONE HCL 5 MG PO TABS: 5.0000 mg | ORAL_TABLET | ORAL | Status: DC | PRN

## 2018-07-10 MED ORDER — HYDROMORPHONE HCL 1 MG/ML IJ SOLN: 0.2500 mg | INTRAMUSCULAR | Status: DC | PRN

## 2018-07-10 MED ORDER — MUPIROCIN 2 % EX OINT
1.0000 "application " | TOPICAL_OINTMENT | Freq: Two times a day (BID) | CUTANEOUS | Status: DC
  Administered 2018-07-10 – 2018-07-13 (×6): 1 via NASAL
  Filled 2018-07-10 (×2): qty 22

## 2018-07-10 MED ORDER — SODIUM CHLORIDE 0.9% IV SOLUTION
Freq: Once | INTRAVENOUS | Status: AC
  Administered 2018-07-11: 01:00:00 via INTRAVENOUS

## 2018-07-10 MED ORDER — FENTANYL CITRATE (PF) 250 MCG/5ML IJ SOLN
INTRAMUSCULAR | Status: AC
  Filled 2018-07-10: qty 5

## 2018-07-10 MED ORDER — 0.9 % SODIUM CHLORIDE (POUR BTL) OPTIME
TOPICAL | Status: DC | PRN
  Administered 2018-07-10: 1000 mL

## 2018-07-10 MED ORDER — ONDANSETRON HCL 4 MG/2ML IJ SOLN
INTRAMUSCULAR | Status: DC | PRN
  Administered 2018-07-10: 4 mg via INTRAVENOUS

## 2018-07-10 MED ORDER — PROPOFOL 10 MG/ML IV BOLUS
INTRAVENOUS | Status: AC
  Filled 2018-07-10: qty 40

## 2018-07-10 MED ORDER — FENTANYL CITRATE (PF) 100 MCG/2ML IJ SOLN
INTRAMUSCULAR | Status: DC | PRN
  Administered 2018-07-10: 100 ug via INTRAVENOUS
  Administered 2018-07-10: 50 ug via INTRAVENOUS

## 2018-07-10 MED ORDER — IOPAMIDOL (ISOVUE-300) INJECTION 61%
INTRAVENOUS | Status: AC
  Filled 2018-07-10: qty 50

## 2018-07-10 MED ORDER — DEXAMETHASONE SODIUM PHOSPHATE 10 MG/ML IJ SOLN
INTRAMUSCULAR | Status: AC
  Filled 2018-07-10: qty 1

## 2018-07-10 MED ORDER — DEXAMETHASONE SODIUM PHOSPHATE 10 MG/ML IJ SOLN
INTRAMUSCULAR | Status: DC | PRN
  Administered 2018-07-10: 10 mg via INTRAVENOUS

## 2018-07-10 MED ORDER — GLYCOPYRROLATE 0.2 MG/ML IJ SOLN
INTRAMUSCULAR | Status: DC | PRN
  Administered 2018-07-10: 0.4 mg via INTRAVENOUS

## 2018-07-10 MED ORDER — NEOSTIGMINE METHYLSULFATE 10 MG/10ML IV SOLN
INTRAVENOUS | Status: DC | PRN
  Administered 2018-07-10: 3 mg via INTRAVENOUS

## 2018-07-10 MED ORDER — SODIUM CHLORIDE 0.9 % IV SOLN
INTRAVENOUS | Status: DC
  Administered 2018-07-10 (×3): via INTRAVENOUS

## 2018-07-10 MED ORDER — PROPOFOL 10 MG/ML IV BOLUS
INTRAVENOUS | Status: DC | PRN
  Administered 2018-07-10: 150 mg via INTRAVENOUS

## 2018-07-10 MED ORDER — BUPIVACAINE-EPINEPHRINE (PF) 0.25% -1:200000 IJ SOLN
INTRAMUSCULAR | Status: AC
  Filled 2018-07-10: qty 30

## 2018-07-10 MED ORDER — SODIUM CHLORIDE 0.9 % IV BOLUS: 1000.0000 mL | Freq: Once | INTRAVENOUS | Status: DC

## 2018-07-10 MED ORDER — LIDOCAINE 2% (20 MG/ML) 5 ML SYRINGE
INTRAMUSCULAR | Status: DC | PRN
  Administered 2018-07-10: 60 mg via INTRAVENOUS

## 2018-07-10 MED ORDER — CALCIUM CARBONATE 1250 (500 CA) MG PO TABS
1250.0000 mg | ORAL_TABLET | Freq: Two times a day (BID) | ORAL | Status: DC
  Administered 2018-07-12 – 2018-07-13 (×2): 1250 mg via ORAL
  Filled 2018-07-10 (×6): qty 1

## 2018-07-10 MED ORDER — SODIUM CHLORIDE 0.9 % IV SOLN
INTRAVENOUS | Status: DC | PRN
  Administered 2018-07-10: 5 mL

## 2018-07-10 MED ORDER — HYDROMORPHONE HCL 1 MG/ML IJ SOLN: 0.5000 mg | INTRAMUSCULAR | Status: DC | PRN

## 2018-07-10 SURGICAL SUPPLY — 44 items
APPLIER CLIP ROT 10 11.4 M/L (STAPLE) ×2
BENZOIN TINCTURE PRP APPL 2/3 (GAUZE/BANDAGES/DRESSINGS) ×2 IMPLANT
BLADE CLIPPER SURG (BLADE) ×2 IMPLANT
CANISTER SUCT 3000ML PPV (MISCELLANEOUS) ×2 IMPLANT
CHLORAPREP W/TINT 26ML (MISCELLANEOUS) ×2 IMPLANT
CLIP APPLIE ROT 10 11.4 M/L (STAPLE) ×1 IMPLANT
COVER MAYO STAND STRL (DRAPES) ×2 IMPLANT
COVER SURGICAL LIGHT HANDLE (MISCELLANEOUS) ×2 IMPLANT
COVER WAND RF STERILE (DRAPES) ×2 IMPLANT
DRAPE C-ARM 42X72 X-RAY (DRAPES) ×2 IMPLANT
DRSG TEGADERM 2-3/8X2-3/4 SM (GAUZE/BANDAGES/DRESSINGS) ×6 IMPLANT
DRSG TEGADERM 4X4.75 (GAUZE/BANDAGES/DRESSINGS) ×2 IMPLANT
ELECT REM PT RETURN 9FT ADLT (ELECTROSURGICAL) ×2
ELECTRODE REM PT RTRN 9FT ADLT (ELECTROSURGICAL) ×1 IMPLANT
FILTER SMOKE EVAC LAPAROSHD (FILTER) ×2 IMPLANT
GAUZE SPONGE 2X2 8PLY STRL LF (GAUZE/BANDAGES/DRESSINGS) ×1 IMPLANT
GLOVE BIO SURGEON STRL SZ7 (GLOVE) ×2 IMPLANT
GLOVE BIOGEL PI IND STRL 7.5 (GLOVE) ×1 IMPLANT
GLOVE BIOGEL PI INDICATOR 7.5 (GLOVE) ×1
GOWN STRL REUS W/ TWL LRG LVL3 (GOWN DISPOSABLE) ×3 IMPLANT
GOWN STRL REUS W/TWL LRG LVL3 (GOWN DISPOSABLE) ×3
KIT BASIN OR (CUSTOM PROCEDURE TRAY) ×2 IMPLANT
KIT TURNOVER KIT B (KITS) ×2 IMPLANT
NS IRRIG 1000ML POUR BTL (IV SOLUTION) ×2 IMPLANT
PAD ARMBOARD 7.5X6 YLW CONV (MISCELLANEOUS) ×2 IMPLANT
POUCH RETRIEVAL ECOSAC 10 (ENDOMECHANICALS) ×1 IMPLANT
POUCH RETRIEVAL ECOSAC 10MM (ENDOMECHANICALS) ×1
POUCH SPECIMEN RETRIEVAL 10MM (ENDOMECHANICALS) IMPLANT
SCISSORS LAP 5X35 DISP (ENDOMECHANICALS) ×2 IMPLANT
SET CHOLANGIOGRAPH 5 50 .035 (SET/KITS/TRAYS/PACK) ×2 IMPLANT
SET IRRIG TUBING LAPAROSCOPIC (IRRIGATION / IRRIGATOR) ×2 IMPLANT
SLEEVE ENDOPATH XCEL 5M (ENDOMECHANICALS) ×2 IMPLANT
SPECIMEN JAR SMALL (MISCELLANEOUS) ×2 IMPLANT
SPONGE GAUZE 2X2 STER 10/PKG (GAUZE/BANDAGES/DRESSINGS) ×1
STRIP CLOSURE SKIN 1/2X4 (GAUZE/BANDAGES/DRESSINGS) ×2 IMPLANT
SUT MNCRL AB 4-0 PS2 18 (SUTURE) ×2 IMPLANT
TOWEL OR 17X24 6PK STRL BLUE (TOWEL DISPOSABLE) ×2 IMPLANT
TOWEL OR 17X26 10 PK STRL BLUE (TOWEL DISPOSABLE) ×2 IMPLANT
TRAY LAPAROSCOPIC MC (CUSTOM PROCEDURE TRAY) ×2 IMPLANT
TROCAR XCEL BLUNT TIP 100MML (ENDOMECHANICALS) ×2 IMPLANT
TROCAR XCEL NON-BLD 11X100MML (ENDOMECHANICALS) ×2 IMPLANT
TROCAR XCEL NON-BLD 5MMX100MML (ENDOMECHANICALS) ×2 IMPLANT
TUBING INSUFFLATION (TUBING) ×2 IMPLANT
WATER STERILE IRR 1000ML POUR (IV SOLUTION) ×2 IMPLANT

## 2018-07-10 NOTE — Transfer of Care (Signed)
Immediate Anesthesia Transfer of Care Note  Patient: Roger Rollins  Procedure(s) Performed: LAPAROSCOPIC CHOLECYSTECTOMY WITH INTRAOPERATIVE CHOLANGIOGRAM (N/A Abdomen)  Patient Location: PACU  Anesthesia Type:General  Level of Consciousness: awake, alert  and oriented  Airway & Oxygen Therapy: Patient Spontanous Breathing and Patient connected to nasal cannula oxygen  Post-op Assessment: Report given to RN, Post -op Vital signs reviewed and stable and Patient moving all extremities X 4  Post vital signs: Reviewed and stable  Last Vitals:  Vitals Value Taken Time  BP 102/64 07/10/2018  3:01 PM  Temp    Pulse 57 07/10/2018  3:05 PM  Resp 16 07/10/2018  3:05 PM  SpO2 100 % 07/10/2018  3:05 PM  Vitals shown include unvalidated device data.  Last Pain:  Vitals:   07/10/18 1107  TempSrc: Oral  PainSc:          Complications: No apparent anesthesia complications

## 2018-07-10 NOTE — Progress Notes (Signed)
Internal Medicine Clinic Attending  Case discussed with Dr. Amin at the time of the visit.  We reviewed the resident's history and exam and pertinent patient test results.  I agree with the assessment, diagnosis, and plan of care documented in the resident's note.    

## 2018-07-10 NOTE — Anesthesia Procedure Notes (Signed)
Procedure Name: Intubation Date/Time: 07/10/2018 1:47 PM Performed by: Leonor Liv, CRNA Pre-anesthesia Checklist: Patient identified, Emergency Drugs available, Suction available and Patient being monitored Patient Re-evaluated:Patient Re-evaluated prior to induction Oxygen Delivery Method: Circle System Utilized Preoxygenation: Pre-oxygenation with 100% oxygen Induction Type: IV induction Ventilation: Mask ventilation without difficulty Laryngoscope Size: Mac and 4 Grade View: Grade I Tube type: Oral Tube size: 7.5 mm Number of attempts: 1 Airway Equipment and Method: Stylet and Oral airway Placement Confirmation: ETT inserted through vocal cords under direct vision,  positive ETCO2 and breath sounds checked- equal and bilateral Secured at: 23 cm Tube secured with: Tape Dental Injury: Teeth and Oropharynx as per pre-operative assessment

## 2018-07-10 NOTE — Progress Notes (Signed)
   Subjective/Chief Complaint: Patient had dialysis yesterday Minimal abdominal pain today   Objective: Vital signs in last 24 hours: Temp:  [98 F (36.7 C)-98.3 F (36.8 C)] 98.1 F (36.7 C) (11/20 0723) Pulse Rate:  [59-72] 64 (11/20 0723) Resp:  [18-20] 18 (11/20 0723) BP: (146-190)/(57-82) 187/82 (11/20 0723) SpO2:  [96 %-99 %] 99 % (11/20 0723) Weight:  [69.4 kg-71.6 kg] 69.4 kg (11/19 1615) Last BM Date: 07/08/18  Intake/Output from previous day: 11/19 0701 - 11/20 0700 In: 220 [P.O.:220] Out: 2002  Intake/Output this shift: No intake/output data recorded.  General appearance: alert, cooperative and no distress GI: soft, mild RUQ tenderness; no palpable masses Skin: no sign of jaundice  Lab Results:  Recent Labs    07/08/18 1620 07/09/18 0734  WBC 6.8 6.2  HGB 8.6* 8.4*  HCT 28.1* 26.0*  PLT 147* 135*   BMET Recent Labs    07/08/18 1620 07/09/18 0450  NA 141 140  K 5.1 5.2*  CL 103 102  CO2 23 22  GLUCOSE 106* 82  BUN 75* 80*  CREATININE 9.86* 10.74*  CALCIUM 9.3 8.9   PT/INR No results for input(s): LABPROT, INR in the last 72 hours. ABG No results for input(s): PHART, HCO3 in the last 72 hours.  Invalid input(s): PCO2, PO2  Studies/Results: [place] Abdomen Limited Ruq  Result Date: 07/08/2018 CLINICAL DATA:  Right upper quadrant pain. Hypertension and diabetes. EXAM: ULTRASOUND ABDOMEN LIMITED RIGHT UPPER QUADRANT COMPARISON:  None. FINDINGS: Gallbladder: No gallstones identified. There is diffuse gallbladder wall edema which measures 7.5 mm in thickness. Pericholecystic fluid noted. Common bile duct: Diameter: 5.5 mm. Liver: No focal lesion identified. Within normal limits in parenchymal echogenicity. Intrahepatic biliary ductal dilatation noted. Portal vein is patent on color Doppler imaging with normal direction of blood flow towards the liver. IMPRESSION: 1. Diffuse gallbladder wall edema with pericholecystic fluid compatible with  cholecystitis. 2. Intrahepatic bile duct dilatation with normal caliber common bile duct. Electronically Signed   By: Signa Kellaylor  Stroud M.D.   On: 07/08/2018 20:37    Anti-infectives: Anti-infectives (From admission, onward)   Start     Dose/Rate Route Frequency Ordered Stop   07/08/18 2315  metroNIDAZOLE (FLAGYL) IVPB 500 mg     500 mg 100 mL/hr over 60 Minutes Intravenous Every 8 hours 07/08/18 2056     07/08/18 2100  cefTRIAXone (ROCEPHIN) 2 g in sodium chloride 0.9 % 100 mL IVPB     2 g 200 mL/hr over 30 Minutes Intravenous Every 24 hours 07/08/18 2056        Assessment/Plan: Acute cholecystitis - acalculous Normal LFT's  Will plan lap cholecystectomy with intraoperative cholangiogram.  The surgical procedure has been discussed with the patient.  Potential risks, benefits, alternative treatments, and expected outcomes have been explained.  All of the patient's questions at this time have been answered.  The likelihood of reaching the patient's treatment goal is good.  The patient understand the proposed surgical procedure and wishes to proceed.   LOS: 2 days    Wynona LunaMatthew K Shayleigh Bouldin 07/10/2018

## 2018-07-10 NOTE — Progress Notes (Signed)
POCUS of RUQ preformed with Dr Nelson ChimesAmin, images saved, distended gallbladder, CBD measured 0.53cm, shadowing present, sonographic murphy's sign positive. Suggesting possible Cholecystitis and cholelithiasis without choledocholithiasis.

## 2018-07-10 NOTE — Op Note (Signed)
Laparoscopic Cholecystectomy with IOC Procedure Note  Indications: This patient presents with symptomatic gallbladder disease and will undergo laparoscopic cholecystectomy.  Pre-operative Diagnosis: Acute cholecystitis  Post-operative Diagnosis: Same  Surgeon: Wynona Luna   Assistants: none  Anesthesia: General endotracheal anesthesia  ASA Class: 2  Procedure Details  The patient was seen again in the Holding Room. The risks, benefits, complications, treatment options, and expected outcomes were discussed with the patient. The possibilities of reaction to medication, pulmonary aspiration, perforation of viscus, bleeding, recurrent infection, finding a normal gallbladder, the need for additional procedures, failure to diagnose a condition, the possible need to convert to an open procedure, and creating a complication requiring transfusion or operation were discussed with the patient. The likelihood of improving the patient's symptoms with return to their baseline status is good.  The patient and/or family concurred with the proposed plan, giving informed consent. The site of surgery properly noted. The patient was taken to Operating Room, identified as Roger Rollins and the procedure verified as Laparoscopic Cholecystectomy with Intraoperative Cholangiogram. A Time Out was held and the above information confirmed.  Prior to the induction of general anesthesia, antibiotic prophylaxis was administered. General endotracheal anesthesia was then administered and tolerated well. After the induction, the abdomen was prepped with Chloraprep and draped in the sterile fashion. The patient was positioned in the supine position.  Local anesthetic agent was injected into the skin near the umbilicus and an incision made. We dissected down to the abdominal fascia with blunt dissection.  The fascia was incised vertically and we entered the peritoneal cavity bluntly.  A pursestring suture of 0-Vicryl  was placed around the fascial opening.  The Hasson cannula was inserted and secured with the stay suture.  Pneumoperitoneum was then created with CO2 and tolerated well without any adverse changes in the patient's vital signs. An 11-mm port was placed in the subxiphoid position.  Two 5-mm ports were placed in the right upper quadrant. All skin incisions were infiltrated with a local anesthetic agent before making the incision and placing the trocars.   We positioned the patient in reverse Trendelenburg, tilted slightly to the patient's left.  The gallbladder was identified, the fundus grasped and retracted cephalad. The gallbladder is thickened and there is some pericholecystic/ perihepatic fluid.  Adhesions were lysed bluntly and with the electrocautery where indicated, taking care not to injure any adjacent organs or viscus. The infundibulum was grasped and retracted laterally, exposing the peritoneum overlying the triangle of Calot. This was then divided and exposed in a blunt fashion. A critical view of the cystic duct and cystic artery was obtained.  The cystic duct was clearly identified and bluntly dissected circumferentially. The cystic duct was ligated with a clip distally.   An incision was made in the cystic duct and the [facility] cholangiogram catheter introduced. The catheter was secured using a clip. A cholangiogram was then obtained which showed good visualization of the distal and proximal biliary tree with no sign of filling defects or obstruction.  Contrast flowed easily into the duodenum. The catheter was then removed.   The cystic duct was then ligated with clips and divided. The cystic artery was identified, dissected free, ligated with clips and divided as well.   The gallbladder was dissected from the liver bed in retrograde fashion with the electrocautery. The gallbladder was removed and placed in an Endocatch sac. The liver bed was irrigated and inspected. Hemostasis was achieved with the  electrocautery. Copious irrigation was utilized and was  repeatedly aspirated until clear.  The gallbladder and Endocatch sac were then removed through the umbilical port site.  The pursestring suture was used to close the umbilical fascia.    We again inspected the right upper quadrant for hemostasis.  Pneumoperitoneum was released as we removed the trocars.  4-0 Monocryl was used to close the skin.   Benzoin, steri-strips, and clean dressings were applied. The patient was then extubated and brought to the recovery room in stable condition. Instrument, sponge, and needle counts were correct at closure and at the conclusion of the case.   Findings: Cholecystitis without Cholelithiasis  Estimated Blood Loss: less than 50 mL         Drains: none         Specimens: Gallbladder           Complications: None; patient tolerated the procedure well.         Disposition: PACU - hemodynamically stable.         Condition: stable   Wilmon ArmsMatthew K. Corliss Skainssuei, MD, [facility]  General/ Trauma Surgery Beeper [phone]  07/10/2018 2:54 PM

## 2018-07-10 NOTE — Progress Notes (Addendum)
Paged by RN that patient's BP was 81/42 and he was somnolent. He is s/p lap cholecystectomy. When evaluated at the bedside he was arousable and able to answer questions appropriately but somnolent. Repeat BP check was 89/43. His family was at the bedside and concerned that he was becoming apneic. His extremities were warm to touch, pulses intact and surgical sites were dry and clean. He was oxygenating well on room air.   Vitals:   07/10/18 2209 07/10/18 2223  BP: (!) 81/42 (!) 89/43  Pulse: (!) 50 (!) 52  Resp: 18 18  Temp:    SpO2: 100% 93%    Physical Exam  Cardiovascular: Intact distal pulses.  Murmur heard. Pulmonary/Chest: Breath sounds normal. No respiratory distress. He has no wheezes.  Abdominal: Soft. Bowel sounds are normal. He exhibits no distension.  Neurological:  Somnolent on exam  Skin: Skin is warm and dry.    Patient had HD yesterday with ~2L taken off. There were no complications during surgery and minimal blood loss. He was given fentanyl at 334 pm. No pain medications were given post operatively. Held night time BP medications hydralazine and clonidine; they were not given this evening. Possibly due to combination of HD and insensible losses from surgery. Mentation improved with initial bolus. Will continue to closely monitor.  -ordered 1 L NS bolus  -f/u CBC and renal function panel    Patient seen again at 12:45 pm. BP improved however he was continuing to have apneic episodes lasting 5-8 seconds. Rapid response had been called and patient was given narcan and symptoms improved significantly.

## 2018-07-10 NOTE — Progress Notes (Signed)
   Subjective: No acute events overnight. Patient reports that his abdominal pain has improved significantly, currently rates the pain as a 2-3/10. Denies further nausea or vomiting.   Of note, patient has been consistently hypertensive while inpatient. Denies headaches, chest pain or shortness of breath. When interviewed further he stated that he recently has had a lot of fluctuations in his BP. Also reports that his dialysis team will occasionally change up the medications and are following this closely. He does express concern that one of the medications is causing him to feel anxious (last antihypertensive added was clonidine in 12/18).  Objective:  Vital signs in last 24 hours: Vitals:   07/09/18 2013 07/10/18 0614 07/10/18 0723 07/10/18 1107  BP: (!) 159/57 (!) 185/64 (!) 187/82 (!) 148/65  Pulse: 64 64 64 (!) 57  Resp: 20 20 18 16   Temp: 98.3 F (36.8 C) 98.2 F (36.8 C) 98.1 F (36.7 C) 97.9 F (36.6 C)  TempSrc: Oral Oral Oral Oral  SpO2: 96% 97% 99% 99%  Weight:       Gen: Well-developed, well-nourished adult male lying in bed, alert, NAD, answers questions appropriately CV: Regular rate and rhythm, 3/6 holosystolic murmur heard best at the upper sternal borders Pulm: CTAB, no increased WOB, no accessory muscle use Abdomen: Soft, non-distended, epigastric and RUQ tenderness to palpation, BS WNL   Assessment/Plan: [name] is a very pleasant 57 y/o male with PMHx of DM, HTN, HF, GERD, and ESRD on Tu/Thurs/Sat dialysis who presented as a direct admit from clinic for abdominal pain with U/S findings c/w cholecystitis.    Active Problems:   Abdominal pain   Cholecystitis, acute  Acute Cholecystitis confirmed on RUQ U/S: LFTs and lipase normal. No fever or leukocytosis. Have initiated ceftriaxone for empiric therapy. Plan for lap cholecystectomy today. - Acetaminophen 650 mg tablet q6h PRN for pain - Dilaudid for post-op pain  Nausea/Vomiting: Improved; no emesis  since admission.  -Ondansteron 4 mg tablet q6h PRN  ESRD: on HD Tues/Thurs/Sat schedule.  - Appreciate Nephrology following for inpatient HD management; had HD session yesterday  Diabetes: well controlled; not currently on any medications outpatient  - SSI as inpatient   Normocytic Anemia: CBC revealing a drop in hemoglobin to 8.6 from baseline of ~11-12 one year ago. Normocytic anemia likely due to anemia of chronic disease/ESRD. Getting ESA and weekly IV Fe with HD. Colonoscopy within last 2-3 years. No acute intervention warranted, follow up in outpatient setting.  HFpEF: Chronic, stable. - Carvedilol 25 mg daily  HTN: Chronic, but reports recent fluctuations in BP which could be related to progressive renal disease. Pt currently asymptomatic. Will check orthostatics and continue to monitor. - Orthostatics - Amlodipine 10 mg daily - Clonidine 0.1mg  daily - Hydralazine 25 mg 1.5 tablets (37.5mg ) TID  Dispo: Anticipated discharge in approximately 1-2 day(s).   Kerby MoorsStoneking, Shelby E, Medical Student 07/10/2018, 12:01 PM Pager: [phone]

## 2018-07-10 NOTE — Progress Notes (Signed)
Subjective:  About to leave for or Cholecystectomy   ."tolerated hd yest"  Objective Vital signs in last 24 hours: Vitals:   07/09/18 2013 07/10/18 0614 07/10/18 0723 07/10/18 1107  BP: (!) 159/57 (!) 185/64 (!) 187/82 (!) 148/65  Pulse: 64 64 64 (!) 57  Resp: 20 20 18 16   Temp: 98.3 F (36.8 C) 98.2 F (36.8 C) 98.1 F (36.7 C) 97.9 F (36.6 C)  TempSrc: Oral Oral Oral Oral  SpO2: 96% 97% 99% 99%  Weight:       Weight change:   Physical Exam: General: alert, Ox3   ,NAD ,appropriate  Heart: RRR 2/6 sem  No r,g  Lungs:CTA  Abdomen: BS pos  Soft ,  ND ,Tenderness right quad.   Extremities: no pedal edema  Dialysis Access:pos bruit LUA AVF   OP Dialysis Orders: Center: [facility]   on TTS 4 hr . EDW 69kg HD Bath 2k, 2ca   Heparin NONE. Access LUA AVF     Calcitriol 2.5 mcg po /hd  Mircera 150  q 2wks (last given 07/04/18) hgb 9.0  07/04/18       Venofer  50 mg q weekly hd     Problem/Plan: 1. ESRD -  HD  (TTS schedule)  2. Abd pain with cholecystitis on [place] -  Cholecystectomy today  3. Hypertension/volume  - hd yest to edw and  no excess vol on exam,  bp 148/65  this am //continue bp meds for now  , fu trend in hosp. Possible tapering back meds if able sec HD pt. 4. Anemia  - hgb9.2   Next ESA 11/28, weekly iv fe on hd  5. Metabolic bone disease -  Po vit d on hd , binders when po diet ,  6. DM type 2 - per admit   Lenny Pastel, PA-C [facility] Beeper [phone] 07/10/2018,1:11 PM  LOS: 2 days   Labs: Basic Metabolic Panel: Recent Labs  Lab 07/08/18 1620 07/09/18 0450 07/10/18 1139  NA 141 140 141  K 5.1 5.2* 4.0  CL 103 102  --   CO2 23 22  --   GLUCOSE 106* 82 90  BUN 75* 80*  --   CREATININE 9.86* 10.74*  --   CALCIUM 9.3 8.9  --   PHOS  --  6.3*  --    Liver Function Tests: Recent Labs  Lab 07/08/18 1620 07/09/18 0450  AST 24  --   ALT 34  --   ALKPHOS 71  --   BILITOT 0.8  --   PROT 6.4*  --   ALBUMIN 3.6 3.3*   Recent Labs   Lab 07/08/18 1620  LIPASE 64*   No results for input(s): AMMONIA in the last 168 hours. CBC: Recent Labs  Lab 07/08/18 1620 07/09/18 0734 07/10/18 1139  WBC 6.8 6.2  --   NEUTROABS 5.7 4.9  --   HGB 8.6* 8.4* 9.2*  HCT 28.1* 26.0* 27.0*  MCV 98.6 96.7  --   PLT 147* 135*  --    Cardiac Enzymes: No results for input(s): CKTOTAL, CKMB, CKMBINDEX, TROPONINI in the last 168 hours. CBG: Recent Labs  Lab 07/09/18 2013 07/10/18 0012 07/10/18 0405 07/10/18 0723 07/10/18 1055  GLUCAP 177* 89 84 86 87    Studies/Results: US Abdomen Limited Ruq  Result Date: 07/08/2018 CLINICAL DATA:  Right upper quadrant pain. Hypertension and diabetes. EXAM: ULTRASOUND ABDOMEN LIMITED RIGHT UPPER QUADRANT COMPARISON:  None. FINDINGS: Gallbladder: No gallstones identified. There is diffuse  gallbladder wall edema which measures 7.5 mm in thickness. Pericholecystic fluid noted. Common bile duct: Diameter: 5.5 mm. Liver: No focal lesion identified. Within normal limits in parenchymal echogenicity. Intrahepatic biliary ductal dilatation noted. Portal vein is patent on color Doppler imaging with normal direction of blood flow towards the liver. IMPRESSION: 1. Diffuse gallbladder wall edema with pericholecystic fluid compatible with cholecystitis. 2. Intrahepatic bile duct dilatation with normal caliber common bile duct. Electronically Signed   By: Signa Kellaylor  Stroud M.D.   On: 07/08/2018 20:37   Medications: . sodium chloride 10 mL/hr at 07/10/18 1145  . [MAR Hold] cefTRIAXone (ROCEPHIN)  IV 2 g (07/09/18 2106)  . [MAR Hold] ferric gluconate (FERRLECIT/NULECIT) IV    . [MAR Hold] metronidazole 500 mg (07/10/18 0546)   . [MAR Hold] amLODipine  10 mg Oral Daily  . [MAR Hold] aspirin EC  81 mg Oral Daily  . [MAR Hold] calcitRIOL  2.5 mcg Oral Q T,Th,Sa-HD  . [MAR Hold] calcium carbonate  1,250 mg Oral BID WC  . [MAR Hold] carvedilol  25 mg Oral BID WC  . [MAR Hold] Chlorhexidine Gluconate Cloth  6 each  Topical Daily  . [MAR Hold] cloNIDine  0.1 mg Oral BID  . [MAR Hold] darbepoetin (ARANESP) injection - DIALYSIS  150 mcg Intravenous Q Thu-HD  . [MAR Hold] heparin  5,000 Units Subcutaneous Q8H  . [MAR Hold] hydrALAZINE  37.5 mg Oral TID  . [MAR Hold] insulin aspart  0-5 Units Subcutaneous QHS  . [MAR Hold] insulin aspart  0-9 Units Subcutaneous TID WC  . [MAR Hold] isosorbide mononitrate  60 mg Oral Daily  . [MAR Hold] mupirocin ointment  1 application Nasal BID  . [MAR Hold] pantoprazole  40 mg Oral Daily

## 2018-07-10 NOTE — Anesthesia Preprocedure Evaluation (Addendum)
Anesthesia Evaluation  Patient identified by MRN, date of birth, ID band Patient awake    Reviewed: Allergy & Precautions, NPO status , Patient's Chart, lab work & pertinent test results  History of Anesthesia Complications Negative for: history of anesthetic complications  Airway Mallampati: II  TM Distance: >3 FB Neck ROM: Full    Dental  (+) Dental Advisory Given   Pulmonary neg pulmonary ROS,    breath sounds clear to auscultation       Cardiovascular hypertension, Pt. on medications and Pt. on home beta blockers  Rhythm:Regular Rate:Normal     Neuro/Psych negative neurological ROS     GI/Hepatic Neg liver ROS, GERD  ,  Endo/Other  diabetes  Renal/GU ESRF and DialysisRenal disease     Musculoskeletal   Abdominal   Peds  Hematology  (+) anemia ,   Anesthesia Other Findings   Reproductive/Obstetrics                            Lab Results  Component Value Date   WBC 6.2 07/09/2018   HGB 9.2 (L) 07/10/2018   HCT 27.0 (L) 07/10/2018   MCV 96.7 07/09/2018   PLT 135 (L) 07/09/2018   Lab Results  Component Value Date   CREATININE 10.74 (H) 07/09/2018   BUN 80 (H) 07/09/2018   NA 141 07/10/2018   K 4.0 07/10/2018   CL 102 07/09/2018   CO2 22 07/09/2018    Anesthesia Physical Anesthesia Plan  ASA: III  Anesthesia Plan: General   Post-op Pain Management:    Induction: Intravenous  PONV Risk Score and Plan: 2 and Dexamethasone, Ondansetron and Treatment may vary due to age or medical condition  Airway Management Planned: Oral ETT  Additional Equipment:   Intra-op Plan:   Post-operative Plan: Extubation in OR  Informed Consent: I have reviewed the patients History and Physical, chart, labs and discussed the procedure including the risks, benefits and alternatives for the proposed anesthesia with the patient or authorized representative who has indicated his/her  understanding and acceptance.   Dental advisory given  Plan Discussed with: CRNA  Anesthesia Plan Comments:         Anesthesia Quick Evaluation

## 2018-07-11 ENCOUNTER — Inpatient Hospital Stay (HOSPITAL_COMMUNITY): Payer: Medicaid Other | Admitting: Certified Registered Nurse Anesthetist

## 2018-07-11 ENCOUNTER — Encounter (HOSPITAL_COMMUNITY)
Admission: AD | Disposition: A | Discharge status: Home / Self Care | Payer: Self-pay | Source: Home / Self Care | Attending: Internal Medicine

## 2018-07-11 ENCOUNTER — Encounter (HOSPITAL_COMMUNITY): Payer: Self-pay | Admitting: Critical Care Medicine

## 2018-07-11 DIAGNOSIS — Z9889 Other specified postprocedural states: Secondary | ICD-10-CM

## 2018-07-11 DIAGNOSIS — K661 Hemoperitoneum: Secondary | ICD-10-CM

## 2018-07-11 DIAGNOSIS — I959 Hypotension, unspecified: Secondary | ICD-10-CM

## 2018-07-11 DIAGNOSIS — D62 Acute posthemorrhagic anemia: Secondary | ICD-10-CM

## 2018-07-11 DIAGNOSIS — R4182 Altered mental status, unspecified: Secondary | ICD-10-CM

## 2018-07-11 HISTORY — PX: CHOLECYSTECTOMY: SHX55

## 2018-07-11 LAB — PREPARE RBC (CROSSMATCH)

## 2018-07-11 LAB — PROTIME-INR
INR: 1.4
Prothrombin Time: 17 seconds — ABNORMAL HIGH (ref 11.4–15.2)

## 2018-07-11 LAB — RENAL FUNCTION PANEL
ANION GAP: 19 — AB (ref 5–15)
Albumin: 2.2 g/dL — ABNORMAL LOW (ref 3.5–5.0)
BUN: 40 mg/dL — ABNORMAL HIGH (ref 6–20)
CHLORIDE: 106 mmol/L (ref 98–111)
CO2: 14 mmol/L — AB (ref 22–32)
CREATININE: 8.06 mg/dL — AB (ref 0.61–1.24)
Calcium: 7.4 mg/dL — ABNORMAL LOW (ref 8.9–10.3)
GFR, EST AFRICAN AMERICAN: 8 mL/min — AB (ref >60)
GFR, EST NON AFRICAN AMERICAN: 7 mL/min — AB (ref >60)
Glucose, Bld: 165 mg/dL — ABNORMAL HIGH (ref 70–99)
POTASSIUM: 5.4 mmol/L — AB (ref 3.5–5.1)
Phosphorus: 10.3 mg/dL — ABNORMAL HIGH (ref 2.5–4.6)
Sodium: 139 mmol/L (ref 135–145)

## 2018-07-11 LAB — GLUCOSE, CAPILLARY
GLUCOSE-CAPILLARY: 114 mg/dL — AB (ref 70–99)
GLUCOSE-CAPILLARY: 163 mg/dL — AB (ref 70–99)
Glucose-Capillary: 130 mg/dL — ABNORMAL HIGH (ref 70–99)
Glucose-Capillary: 114 mg/dL — ABNORMAL HIGH (ref 70–99)

## 2018-07-11 LAB — CBC
HEMATOCRIT: 18 % — AB (ref 39.0–52.0)
HEMOGLOBIN: 5.6 g/dL — AB (ref 13.0–17.0)
MCH: 31.1 pg (ref 26.0–34.0)
MCHC: 31.1 g/dL (ref 30.0–36.0)
MCV: 100 fL (ref 80.0–100.0)
PLATELETS: 103 10*3/uL — AB (ref 150–400)
RBC: 1.8 MIL/uL — AB (ref 4.22–5.81)
RDW: 16.9 % — ABNORMAL HIGH (ref 11.5–15.5)
WBC: 5.6 10*3/uL (ref 4.0–10.5)
nRBC: 0 % (ref 0.0–0.2)

## 2018-07-11 LAB — ABO/RH: ABO/RH(D): O POS

## 2018-07-11 SURGERY — LAPAROSCOPIC CHOLECYSTECTOMY
Anesthesia: General | Site: Abdomen

## 2018-07-11 MED ORDER — FENTANYL CITRATE (PF) 250 MCG/5ML IJ SOLN
INTRAMUSCULAR | Status: DC | PRN
  Administered 2018-07-11: 50 ug via INTRAVENOUS
  Administered 2018-07-11: 25 ug via INTRAVENOUS
  Administered 2018-07-11: 50 ug via INTRAVENOUS
  Administered 2018-07-11 (×3): 25 ug via INTRAVENOUS

## 2018-07-11 MED ORDER — HYDROMORPHONE HCL 1 MG/ML IJ SOLN: 0.2500 mg | INTRAMUSCULAR | Status: DC | PRN

## 2018-07-11 MED ORDER — GLYCOPYRROLATE PF 0.2 MG/ML IJ SOSY
PREFILLED_SYRINGE | INTRAMUSCULAR | Status: DC | PRN
  Administered 2018-07-11: 0.4 mg via INTRAVENOUS

## 2018-07-11 MED ORDER — BUPIVACAINE-EPINEPHRINE 0.25% -1:200000 IJ SOLN
INTRAMUSCULAR | Status: AC
  Filled 2018-07-11: qty 1

## 2018-07-11 MED ORDER — MUPIROCIN 2 % EX OINT: 1.0000 "application " | TOPICAL_OINTMENT | Freq: Two times a day (BID) | CUTANEOUS | Status: DC

## 2018-07-11 MED ORDER — SODIUM CHLORIDE 0.9% IV SOLUTION: Freq: Once | INTRAVENOUS | Status: DC

## 2018-07-11 MED ORDER — SUGAMMADEX SODIUM 200 MG/2ML IV SOLN
INTRAVENOUS | Status: DC | PRN
  Administered 2018-07-11: 140 mg via INTRAVENOUS

## 2018-07-11 MED ORDER — SODIUM CHLORIDE 0.9 % IV SOLN
INTRAVENOUS | Status: DC | PRN
  Administered 2018-07-11: 15 ug/min via INTRAVENOUS

## 2018-07-11 MED ORDER — SODIUM CHLORIDE 0.9 % IV SOLN
INTRAVENOUS | Status: DC | PRN
  Administered 2018-07-11: 11:00:00 via INTRAVENOUS

## 2018-07-11 MED ORDER — KETOROLAC TROMETHAMINE 15 MG/ML IJ SOLN
15.0000 mg | Freq: Four times a day (QID) | INTRAMUSCULAR | Status: DC | PRN
  Administered 2018-07-11: 15 mg via INTRAVENOUS
  Filled 2018-07-11: qty 1

## 2018-07-11 MED ORDER — NEOSTIGMINE METHYLSULFATE 3 MG/3ML IV SOSY
PREFILLED_SYRINGE | INTRAVENOUS | Status: DC | PRN
  Administered 2018-07-11: 3 mg via INTRAVENOUS

## 2018-07-11 MED ORDER — ETOMIDATE 2 MG/ML IV SOLN
INTRAVENOUS | Status: DC | PRN
  Administered 2018-07-11: 10 mg via INTRAVENOUS

## 2018-07-11 MED ORDER — SODIUM CHLORIDE 0.9% IV SOLUTION
Freq: Once | INTRAVENOUS | Status: AC
  Administered 2018-07-11: 15:00:00 via INTRAVENOUS

## 2018-07-11 MED ORDER — MIDAZOLAM HCL 2 MG/2ML IJ SOLN
INTRAMUSCULAR | Status: AC
  Filled 2018-07-11: qty 2

## 2018-07-11 MED ORDER — SODIUM CHLORIDE 0.9 % IV SOLN
0.4000 ug/kg | Freq: Once | INTRAVENOUS | Status: DC
  Filled 2018-07-11: qty 6.9

## 2018-07-11 MED ORDER — STERILE WATER FOR IRRIGATION IR SOLN
Status: DC | PRN
  Administered 2018-07-11: 1000 mL

## 2018-07-11 MED ORDER — HYDRALAZINE HCL 20 MG/ML IJ SOLN
5.0000 mg | INTRAMUSCULAR | Status: DC | PRN
  Administered 2018-07-12 (×2): 5 mg via INTRAVENOUS
  Filled 2018-07-11: qty 1

## 2018-07-11 MED ORDER — 0.9 % SODIUM CHLORIDE (POUR BTL) OPTIME
TOPICAL | Status: DC | PRN
  Administered 2018-07-11: 1000 mL

## 2018-07-11 MED ORDER — LIDOCAINE 2% (20 MG/ML) 5 ML SYRINGE
INTRAMUSCULAR | Status: AC
  Filled 2018-07-11: qty 5

## 2018-07-11 MED ORDER — NALOXONE HCL 0.4 MG/ML IJ SOLN
INTRAMUSCULAR | Status: AC
  Administered 2018-07-11: 0.32 mg
  Filled 2018-07-11: qty 1

## 2018-07-11 MED ORDER — ROCURONIUM BROMIDE 10 MG/ML (PF) SYRINGE
PREFILLED_SYRINGE | INTRAVENOUS | Status: DC | PRN
  Administered 2018-07-11: 40 mg via INTRAVENOUS
  Administered 2018-07-11: 10 mg via INTRAVENOUS
  Administered 2018-07-11: 20 mg via INTRAVENOUS
  Administered 2018-07-11: 10 mg via INTRAVENOUS

## 2018-07-11 MED ORDER — ROCURONIUM BROMIDE 50 MG/5ML IV SOSY
PREFILLED_SYRINGE | INTRAVENOUS | Status: AC
  Filled 2018-07-11: qty 15

## 2018-07-11 MED ORDER — PHENYLEPHRINE 40 MCG/ML (10ML) SYRINGE FOR IV PUSH (FOR BLOOD PRESSURE SUPPORT)
PREFILLED_SYRINGE | INTRAVENOUS | Status: AC
  Filled 2018-07-11: qty 10

## 2018-07-11 MED ORDER — CEFAZOLIN SODIUM 1 G IJ SOLR
INTRAMUSCULAR | Status: AC
  Filled 2018-07-11: qty 20

## 2018-07-11 MED ORDER — DEXAMETHASONE SODIUM PHOSPHATE 10 MG/ML IJ SOLN
INTRAMUSCULAR | Status: DC | PRN
  Administered 2018-07-11: 4 mg via INTRAVENOUS

## 2018-07-11 MED ORDER — HEMOSTATIC AGENTS (NO CHARGE) OPTIME
TOPICAL | Status: DC | PRN
  Administered 2018-07-11: 1 via TOPICAL

## 2018-07-11 MED ORDER — SODIUM CHLORIDE 0.9 % IV SOLN
INTRAVENOUS | Status: DC
  Administered 2018-07-11: 05:00:00 via INTRAVENOUS

## 2018-07-11 MED ORDER — CHLORHEXIDINE GLUCONATE CLOTH 2 % EX PADS: 6.0000 | MEDICATED_PAD | Freq: Every day | CUTANEOUS | Status: DC

## 2018-07-11 MED ORDER — FENTANYL CITRATE (PF) 250 MCG/5ML IJ SOLN
INTRAMUSCULAR | Status: AC
  Filled 2018-07-11: qty 5

## 2018-07-11 MED ORDER — EPHEDRINE 5 MG/ML INJ
INTRAVENOUS | Status: AC
  Filled 2018-07-11: qty 10

## 2018-07-11 MED ORDER — SODIUM CHLORIDE 0.9 % IR SOLN
Status: DC | PRN
  Administered 2018-07-11 (×3): 1000 mL

## 2018-07-11 MED ORDER — BUPIVACAINE-EPINEPHRINE 0.25% -1:200000 IJ SOLN
INTRAMUSCULAR | Status: DC | PRN
  Administered 2018-07-11: 20 mL

## 2018-07-11 MED ORDER — ETOMIDATE 2 MG/ML IV SOLN
INTRAVENOUS | Status: AC
  Filled 2018-07-11: qty 10

## 2018-07-11 MED ORDER — SODIUM CHLORIDE 0.9 % IV SOLN
INTRAVENOUS | Status: DC | PRN
  Administered 2018-07-11 (×2): via INTRAVENOUS

## 2018-07-11 MED ORDER — NALOXONE HCL 0.4 MG/ML IJ SOLN
0.4000 mg | INTRAMUSCULAR | Status: DC | PRN
  Administered 2018-07-11: 0.4 mg via INTRAVENOUS
  Filled 2018-07-11: qty 1

## 2018-07-11 MED ORDER — LABETALOL HCL 5 MG/ML IV SOLN
5.0000 mg | Freq: Once | INTRAVENOUS | Status: AC
  Administered 2018-07-11: 5 mg via INTRAVENOUS

## 2018-07-11 MED ORDER — GLYCOPYRROLATE PF 0.2 MG/ML IJ SOSY
PREFILLED_SYRINGE | INTRAMUSCULAR | Status: AC
  Filled 2018-07-11: qty 1

## 2018-07-11 MED ORDER — CEFAZOLIN SODIUM-DEXTROSE 2-3 GM-%(50ML) IV SOLR
INTRAVENOUS | Status: DC | PRN
  Administered 2018-07-11: 2 g via INTRAVENOUS

## 2018-07-11 MED ORDER — METHOCARBAMOL 500 MG PO TABS
500.0000 mg | ORAL_TABLET | Freq: Three times a day (TID) | ORAL | Status: DC | PRN
  Filled 2018-07-11: qty 1

## 2018-07-11 MED ORDER — SUGAMMADEX SODIUM 200 MG/2ML IV SOLN
INTRAVENOUS | Status: AC
  Filled 2018-07-11: qty 2

## 2018-07-11 MED ORDER — ONDANSETRON HCL 4 MG/2ML IJ SOLN
INTRAMUSCULAR | Status: DC | PRN
  Administered 2018-07-11: 4 mg via INTRAVENOUS

## 2018-07-11 MED ORDER — SUCCINYLCHOLINE CHLORIDE 200 MG/10ML IV SOSY
PREFILLED_SYRINGE | INTRAVENOUS | Status: AC
  Filled 2018-07-11: qty 10

## 2018-07-11 SURGICAL SUPPLY — 40 items
APPLIER CLIP ROT 10 11.4 M/L (STAPLE) ×3
BENZOIN TINCTURE PRP APPL 2/3 (GAUZE/BANDAGES/DRESSINGS) ×3 IMPLANT
BLADE CLIPPER SURG (BLADE) IMPLANT
CANISTER SUCT 3000ML PPV (MISCELLANEOUS) ×3 IMPLANT
CHLORAPREP W/TINT 26ML (MISCELLANEOUS) ×3 IMPLANT
CLIP APPLIE ROT 10 11.4 M/L (STAPLE) ×1 IMPLANT
CLOSURE WOUND 1/2 X4 (GAUZE/BANDAGES/DRESSINGS) ×1
COVER SURGICAL LIGHT HANDLE (MISCELLANEOUS) ×3 IMPLANT
COVER WAND RF STERILE (DRAPES) IMPLANT
DRSG TEGADERM 2-3/8X2-3/4 SM (GAUZE/BANDAGES/DRESSINGS) ×3 IMPLANT
DRSG TEGADERM 4X4.75 (GAUZE/BANDAGES/DRESSINGS) ×3 IMPLANT
ELECT REM PT RETURN 9FT ADLT (ELECTROSURGICAL) ×3
ELECTRODE REM PT RTRN 9FT ADLT (ELECTROSURGICAL) ×1 IMPLANT
FILTER SMOKE EVAC LAPAROSHD (FILTER) ×3 IMPLANT
GAUZE SPONGE 2X2 8PLY STRL LF (GAUZE/BANDAGES/DRESSINGS) ×1 IMPLANT
GLOVE BIO SURGEON STRL SZ7 (GLOVE) ×3 IMPLANT
GLOVE BIOGEL PI IND STRL 7.5 (GLOVE) ×1 IMPLANT
GLOVE BIOGEL PI INDICATOR 7.5 (GLOVE) ×2
GOWN STRL REUS W/ TWL LRG LVL3 (GOWN DISPOSABLE) ×3 IMPLANT
GOWN STRL REUS W/TWL LRG LVL3 (GOWN DISPOSABLE) ×6
KIT BASIN OR (CUSTOM PROCEDURE TRAY) ×3 IMPLANT
KIT TURNOVER KIT B (KITS) ×3 IMPLANT
NS IRRIG 1000ML POUR BTL (IV SOLUTION) ×3 IMPLANT
PAD ARMBOARD 7.5X6 YLW CONV (MISCELLANEOUS) ×3 IMPLANT
POUCH SPECIMEN RETRIEVAL 10MM (ENDOMECHANICALS) IMPLANT
SCISSORS LAP 5X35 DISP (ENDOMECHANICALS) IMPLANT
SET IRRIG TUBING LAPAROSCOPIC (IRRIGATION / IRRIGATOR) ×3 IMPLANT
SLEEVE ENDOPATH XCEL 5M (ENDOMECHANICALS) ×3 IMPLANT
SPECIMEN JAR SMALL (MISCELLANEOUS) ×3 IMPLANT
SPONGE GAUZE 2X2 STER 10/PKG (GAUZE/BANDAGES/DRESSINGS) ×2
STRIP CLOSURE SKIN 1/2X4 (GAUZE/BANDAGES/DRESSINGS) ×2 IMPLANT
SUT MNCRL AB 4-0 PS2 18 (SUTURE) ×3 IMPLANT
TOWEL OR 17X24 6PK STRL BLUE (TOWEL DISPOSABLE) ×3 IMPLANT
TOWEL OR 17X26 10 PK STRL BLUE (TOWEL DISPOSABLE) ×3 IMPLANT
TRAY LAPAROSCOPIC MC (CUSTOM PROCEDURE TRAY) ×3 IMPLANT
TROCAR XCEL BLUNT TIP 100MML (ENDOMECHANICALS) ×3 IMPLANT
TROCAR XCEL NON-BLD 11X100MML (ENDOMECHANICALS) ×3 IMPLANT
TROCAR XCEL NON-BLD 5MMX100MML (ENDOMECHANICALS) ×3 IMPLANT
TUBING INSUFFLATION (TUBING) ×3 IMPLANT
WATER STERILE IRR 1000ML POUR (IV SOLUTION) ×3 IMPLANT

## 2018-07-11 NOTE — Progress Notes (Signed)
Patient arrived to the unit. He is alert and oriented. Skin clean dry and intact. There are Four incisions on the abdomen covered with gauze dressing, clean dry and intact. Patient has Arterial Line and IJ, both areas clean dry and intact. He has been placed on telemetry, CCMD notified, 2nd verification. Patient has been oriented to room and equipment. I will continue to monitor patient.

## 2018-07-11 NOTE — Progress Notes (Signed)
Subjective: Overnight Roger Rollins was noted by family to be apneic for periods of 5-8 seconds and was found to be hypotensive to 81/42 at that time. He was arousable but somnolent when evaluated. Received 1L bolus of fluids. Afterwards hemoglobin came back at 6.5 and he was transfused 1 unit but continued to be hypotensive. He also received 1 dose of narcan due to concern for apnea/AMS.   This AM pt continued to be somnolent but was able to answer questions appropriately. He was endorsing diffuse abdominal pain but had no other complaints. Repeat dose of narcan was given due to concern for mentation status.   Shortly after interview repeat hemoglobin returned at 5.6. Pt evaluated by care team - was still hypotensive and received a dose of desmopressin and two additional units of PRBCs. At that time surgery team was paged and the decision was made to take him back to the OR to investigate bleeding source. In the OR he was found to have a large hemoperitoneum and 3L of blood was evacuated. No clear source of bleeding was identified. Pt stable in PACU, will escalate care.   Objective:  Vital signs in last 24 hours: Vitals:   07/11/18 0941 07/11/18 1004 07/11/18 1300 07/11/18 1315  BP: (!) 102/54 (!) 99/52  (!) 147/62  Pulse: (!) 57 (!) 56  (!) 57  Resp: 18 13  13   Temp: (!) 97.5 F (36.4 C) (!) 97.4 F (36.3 C) (!) 97.3 F (36.3 C)   TempSrc: Oral Oral    SpO2: 100% 97%  100%  Weight:       Gen: Somnolent ill-appearing male lying in bed, arouses to verbal stimuli and answers questions appropriately but quickly falls back asleep CV: Regular rate and rhythm, 3/6 holosystolic murmur heard best at the upper sternal borders Pulm: CTAB, breathing comfortably on RA, no periods of apnea noted Abdomen: Dressings in place over lap chole incisions, no drainage or erythema noted, soft, non-distended, diffuse tenderness to palpation, BS WNL   Assessment/Plan: [name] is a very pleasant  57 y/o male with PMHx of DM, HTN, HF, GERD, and ESRD on Tu/Thurs/Sat dialysis who is POD#1 s/p lap cholecystectomy for cholecystitis.    Active Problems:   ESRD (end stage renal disease) (HCC)   Abdominal pain   Cholecystitis, acute   Gallbladder & bile duct stone, acute cholecystitis and obstruction  Acute Cholecystitis s/p lap chole complicated by hemoperitoneum POD#1:  Hypotensive and somnolent with periods of apnea noted s/p lap chole. Received 1L NS, 3 units PRBCs, and 1 dose of desmopressin for pressure support with good affect. Also given 2 doses of narcan due to altered mental status in the setting of recent intraoperative/post-operative narcotics that may not be clearing well 2/2 ESRD.  Returned to OR where he was found to have large hemoperitoneum.  3L of blood evacuated without clear source of bleeding identified. Found to be hemodynamically stable in the OR. Will escalate level of care to stepdown status. - Closely monitor BP, gentle hydration if hemoglobin stable - will follow-up surgery recommendations  Nausea/Vomiting: Resolved; no emesis since admission.   ESRD: on HD Tues/Thurs/Sat schedule.   - Appreciate Nephrology following for inpatient HD management; held HD session today due to acute bleeding   Diabetes: well controlled; not currently on any medications outpatient  - SSI as inpatient   Normocytic Anemia: Baseline hemoglobin of 9 per pt report, acute blood loss following lap chole with hemoglobin drop to 5.6. S/p 3 units PRBCs,  1 unit platelets. Will check repeat hemoglobin. Getting ESA and weekly IV Fe with HD.  - Monitor CBC - Transfuse PRN  HFpEF: Chronic, stable. - Carvedilol 25 mg daily  HTN: currently holding all anti-hypertensives in the setting of hypotension due to acute blood loss   Dispo: Anticipated discharge in approximately 3-4 day(s).   Kerby MoorsStoneking, Shelby E, Medical Student 07/11/2018, 1:07 PM Pager: [phone]

## 2018-07-11 NOTE — Anesthesia Postprocedure Evaluation (Signed)
Anesthesia Post Note  Patient: Roger Rollins  Procedure(s) Performed: DIAGNOSTIC LAPAROSCOPY; EVACUATION OF HEMOPERITONEUM (N/A Abdomen)     Patient location during evaluation: PACU Anesthesia Type: General Level of consciousness: awake and alert Pain management: pain level controlled Vital Signs Assessment: post-procedure vital signs reviewed and stable Respiratory status: spontaneous breathing, nonlabored ventilation, respiratory function stable and patient connected to nasal cannula oxygen Cardiovascular status: blood pressure returned to baseline and stable Postop Assessment: no apparent nausea or vomiting Anesthetic complications: no    Last Vitals:  Vitals:   07/11/18 1400 07/11/18 1402  BP:  134/61  Pulse:  (!) 56  Resp:  12  Temp: 36.6 C   SpO2:  100%    Last Pain:  Vitals:   07/11/18 1400  TempSrc:   PainSc: 0-No pain                 Kennieth RadFitzgerald, Unika Nazareno E

## 2018-07-11 NOTE — Op Note (Signed)
Diagnostic Laparoscopy Procedure Note  Indications: This patient is s/p laparoscopic cholecystectomy with IOC on 11/20.  The procedure was relatively uncomplicated with no intraoperative bleeding issues.  The patient is a dialysis patient.  Overnight, he became hypotensive and his Hgb was noted to be 5.6.  We decided to immediately return to the OR for diagnostic laparoscopy.  Pre-operative Diagnosis: Hemoperitoneum after laparoscopic cholecystectomy  Post-operative Diagnosis: Hemoperitoneum - unexplained etiology  Surgeon: Wynona Luna   Assistants: Myrtie Soman RNFA  Anesthesia: General endotracheal anesthesia  ASA Class: 3E  Procedure Details  The patient was seen again in the Holding Room. The risks, benefits, complications, treatment options, and expected outcomes were discussed with the patient and/or family.  Diagnostic laparoscopy was recommended to the patient and his family.There was concurrence with the proposed plan and informed consent was obtained. The site of surgery was properly noted. The patient was taken to Operating Room, identified as Rainier Feuerborn and the procedure verified as Diagnostic Laparoscopy. A Time Out was held and the above information confirmed.  The patient was placed in the supine position and general anesthesia was induced.  The abdomen was prepped and draped in a sterile fashion. A one centimeter infraumbilical incision was made through his old incision.  Dissection was carried down to the fascia bluntly.  The fascia was incised vertically.  We entered the peritoneal cavity bluntly.  A pursestring suture was passed around the incision with a 0 Vicryl.  The Hasson cannula was introduced into the abdomen and the tails of the suture were used to hold the Hasson in place.   The insufflated CO2 maintaining a maximum pressure of 15 mmHg.  Some thin bloody fluid was immediately encountered.  We inserted the laparoscope.  There is a large amount of  hemoperitoneum and a large amount of clot in the right upper quadrant.  We open the subxiphoid port site and inserted an 11 mm port.  We placed our 2 5 mm ports in the right upper quadrant.  We then evacuated a large amount of clot and free hemoperitoneum from the abdomen.  We elevated the edge of the liver.  We irrigated the gallbladder fossa.  No bleeding is noted in the gallbladder fossa.  We can see our clips from the cystic duct and the cystic artery stumps.  There is no sign of bleeding in this area.  We irrigated the entire abdomen thoroughly with approximately 3 L of normal saline.  We evacuated all the clot and hemoperitoneum that we could see.  I carefully examined the edge of the liver.  No bleeding is noted behind the liver.  The stomach appears normal.  We irrigated the left upper quadrant.  I can visualize the anterior portion of the spleen.  No blood seems to be accumulating in this area.  The descending colon appears normal.  The sigmoid colon has some flimsy adhesions to the anterior abdominal wall.  There is no bleeding noted.  We evacuated the clot out of the pelvis and irrigated thoroughly.  There is no reaccumulation of any bloody fluid in the pelvis.  The cecum and appendix appeared to be normal.  We examined this much of the small bowel as we could as well as the omentum.  No bleeding is noted there.  We watched carefully for about 10 minutes and there was no reaccumulation of hemoperitoneum in any of the 4 quadrants of the abdomen.  We placed Surgicel snow in the gallbladder fossa just in case the  patient had any further oozing although no bleeding is noted at this time.  The ports were removed.  We released our insufflation.  The umbilical port site was closed with the purse string suture. There was no residual palpable fascial defect.  The trocar site skin wounds were closed with 4-0 Monocryl.  Instrument, sponge, and needle counts were correct at the conclusion of the case.   EBL -  3000 ml         Disposition: PACU - hemodynamically stable.         Condition: stable  Wilmon ArmsMatthew K. Corliss Skainssuei, MD, [facility]  General/ Trauma Surgery Beeper [phone]  07/11/2018 12:58 PM

## 2018-07-11 NOTE — Progress Notes (Signed)
Repeat hemoglobin down to 5.6 from 6.5 after 1 unit PRBCs, BP 93/51. Discussed with Dr. Corliss Skainssuei and will plan to take back to the operating room for a diagnostic laparoscopy due to concern for possible postoperative bleeding. He has had nothing to drink/eat since late last night.  Franne FortsBrooke A Meuth, [facility] Center[facility] 07/11/2018, 9:30 AM Pager: [phone] Mon 7:00 am -11:30 AM Tues-Fri 7:00 am-4:30 pm Sat-Sun 7:00 am-11:30 am

## 2018-07-11 NOTE — Transfer of Care (Signed)
Immediate Anesthesia Transfer of Care Note  Patient: Roger Rollins  Procedure(s) Performed: DIAGNOSTIC LAPAROSCOPY; EVACUATION OF HEMOPERITONEUM (N/A Abdomen)  Patient Location: PACU  Anesthesia Type:General  Level of Consciousness: awake, alert  and oriented  Airway & Oxygen Therapy: Patient Spontanous Breathing and Patient connected to nasal cannula oxygen  Post-op Assessment: Report given to RN and Post -op Vital signs reviewed and stable  Post vital signs: Reviewed and stable  Last Vitals:  Vitals Value Taken Time  BP 198/73 07/11/2018 12:57 PM  Temp    Pulse 72 07/11/2018  1:00 PM  Resp 22 07/11/2018  1:00 PM  SpO2 100 % 07/11/2018  1:00 PM  Vitals shown include unvalidated device data.  Last Pain:  Vitals:   07/11/18 1004  TempSrc: Oral  PainSc:       Patients Stated Pain Goal: 2 (07/11/18 0237)  Complications: No apparent anesthesia complications

## 2018-07-11 NOTE — Progress Notes (Signed)
Patient continues to have apnea lasting to 5-8 seconds. MD on call made aware. RRRN called.

## 2018-07-11 NOTE — Anesthesia Preprocedure Evaluation (Addendum)
Anesthesia Evaluation  Patient identified by MRN, date of birth, ID band Patient awake    Reviewed: Allergy & Precautions, H&P , NPO status , Patient's Chart, lab work & pertinent test results  Airway Mallampati: II  TM Distance: >3 FB Neck ROM: Full    Dental no notable dental hx. (+) Edentulous Upper, Edentulous Lower, Dental Advisory Given   Pulmonary neg pulmonary ROS,    Pulmonary exam normal breath sounds clear to auscultation       Cardiovascular hypertension, Pt. on medications  Rhythm:Regular Rate:Normal     Neuro/Psych negative neurological ROS  negative psych ROS   GI/Hepatic Neg liver ROS, GERD  Medicated,  Endo/Other  diabetes  Renal/GU Renal disease  negative genitourinary   Musculoskeletal   Abdominal   Peds  Hematology negative hematology ROS (+)   Anesthesia Other Findings   Reproductive/Obstetrics negative OB ROS                            Anesthesia Physical Anesthesia Plan  ASA: III  Anesthesia Plan: General   Post-op Pain Management:    Induction: Intravenous  PONV Risk Score and Plan: 3 and Ondansetron, Dexamethasone and Midazolam  Airway Management Planned: Oral ETT  Additional Equipment: Arterial line  Intra-op Plan:   Post-operative Plan: Extubation in OR  Informed Consent: I have reviewed the patients History and Physical, chart, labs and discussed the procedure including the risks, benefits and alternatives for the proposed anesthesia with the patient or authorized representative who has indicated his/her understanding and acceptance.   Dental advisory given  Plan Discussed with: CRNA  Anesthesia Plan Comments:         Anesthesia Quick Evaluation

## 2018-07-11 NOTE — Progress Notes (Signed)
Subjective:  Co abd pain / Noted post op  Over night Lap Chole . Hypotensive and 1 fluid bolus  And HGB 6.5  given  1 unit prbcs  And  Reck hgb 5.6, And Dr Corliss Skainssuei  plans to take back to OR   Objective Vital signs in last 24 hours: Vitals:   07/11/18 0744 07/11/18 0840 07/11/18 0941 07/11/18 1004  BP: (!) 91/54 (!) 93/51 (!) 102/54 (!) 99/52  Pulse: 61 (!) 58 (!) 57 (!) 56  Resp: 18 20 18 13   Temp:  (!) 97.4 F (36.3 C) (!) 97.5 F (36.4 C) (!) 97.4 F (36.3 C)  TempSrc:  Oral Oral Oral  SpO2: 100% 100% 100% 97%  Weight:       Weight change:   Physical Exam: General:alert, NAD  But slightly Anxous with abd discomfort  Heart:RRR 2/6 sem No r,g  Lungs:CTA Abdomen:BS pos , Tenderness ,Soft ,Lap incisions with Dressings intact  .   Extremities:no pedal edema Dialysis Access:pos bruit LUA AVF   OP Dialysis Orders: Center:[facility] T[facility] 4 hr. EDW69kgHD Bath 2k, 2caHeparin NONE. AccessLUA AVF  Calcitriol 2.645mcg po /hd  Mircera 150 q 2wks (last given 07/04/18) hgb 9.0 07/04/18  Venofer 50 mg q weekly hd   Problem/Plan: 1. ESRD -HD  (TTS schedule) hold HD today 2/2 Post op Chole BLd and back to OR now, K 5.4  2. Abd pain with cholecystitis on US - sp Cholecystectomy post op day 1 with drop hgb ? bleed ,CCS taking back to OR now   3. Hypertension/volume - low bp post last pm ?bleed ,  no excess vol on exam, bp 102/54 this am //holding  bp meds /  4. Anemia of ESRD and  No post op Chole bleed - hgb 5.6 / transfusion in or  Per [facility] /Next ESA 11/28, weekly iv fe on hd  5. Metabolic bone disease -Po vit d on hd , binders when po diet ,  6. DM type 2 - per admit  Roger Pastelavid Remingtyn Depaola, PA-C [facility] Beeper [phone] 07/11/2018,12:21 PM  LOS: 3 days   Labs: Basic Metabolic Panel: Recent Labs  Lab 07/09/18 0450 07/10/18 1139 07/10/18 2243 07/11/18 0616  NA 140 141 140 139  K 5.2* 4.0 5.1 5.4*  CL 102  --  104 106  CO2 22  --   23 14*  GLUCOSE 82 90 146* 165*  BUN 80*  --  37* 40*  CREATININE 10.74*  --  7.79* 8.06*  CALCIUM 8.9  --  8.0* 7.4*  PHOS 6.3*  --  8.5* 10.3*   Liver Function Tests: Recent Labs  Lab 07/08/18 1620 07/09/18 0450 07/10/18 2243 07/11/18 0616  AST 24  --   --   --   ALT 34  --   --   --   ALKPHOS 71  --   --   --   BILITOT 0.8  --   --   --   PROT 6.4*  --   --   --   ALBUMIN 3.6 3.3* 2.6* 2.2*   Recent Labs  Lab 07/08/18 1620  LIPASE 64*   No results for input(s): AMMONIA in the last 168 hours. CBC: Recent Labs  Lab 07/08/18 1620 07/09/18 0734 07/10/18 1139 07/10/18 2243 07/11/18 0616  WBC 6.8 6.2  --  7.2 5.6  NEUTROABS 5.7 4.9  --   --   --   HGB 8.6* 8.4* 9.2* 6.5* 5.6*  HCT 28.1* 26.0* 27.0* 20.6*  18.0*  MCV 98.6 96.7  --  100.0 100.0  PLT 147* 135*  --  128* 103*   Cardiac Enzymes: No results for input(s): CKTOTAL, CKMB, CKMBINDEX, TROPONINI in the last 168 hours. CBG: Recent Labs  Lab 07/10/18 1502 07/10/18 1608 07/10/18 2103 07/11/18 0733 07/11/18 1000  GLUCAP 81 93 124* 130* 114*    Studies/Results: Dg Cholangiogram Operative  Result Date: 07/10/2018 CLINICAL DATA:  Intraoperative cholangiogram during laparoscopic cholecystectomy. EXAM: INTRAOPERATIVE CHOLANGIOGRAM FLUOROSCOPY TIME:  23 seconds COMPARISON:  Right upper quadrant abdominal ultrasound-07/08/2018 FINDINGS: Intraoperative cholangiographic images of the right upper abdominal quadrant during laparoscopic cholecystectomy are provided for review. Surgical clips overlie the expected location of the gallbladder fossa. Contrast injection demonstrates selective cannulation of the central aspect of the cystic duct. There is passage of contrast through the central aspect of the cystic duct with filling of a non dilated common bile duct. There is passage of contrast though the CBD and into the descending portion of the duodenum. There is minimal reflux of injected contrast into the common hepatic  duct and central aspect of the non dilated intrahepatic biliary system. There are no discrete filling defects within the opacified portions of the biliary system to suggest the presence of choledocholithiasis. IMPRESSION: No evidence of choledocholithiasis. Electronically Signed   By: Simonne Come M.D.   On: 07/10/2018 14:39   Medications: . sodium chloride 10 mL/hr at 07/10/18 1145  . [MAR Hold] cefTRIAXone (ROCEPHIN)  IV 2 g (07/10/18 2245)  . [MAR Hold] desmopressin (DDAVP) IV    . [MAR Hold] ferric gluconate (FERRLECIT/NULECIT) IV    . [MAR Hold] metronidazole 500 mg (07/11/18 0627)   . [MAR Hold] sodium chloride   Intravenous Once  . sodium chloride   Intravenous Once  . [MAR Hold] aspirin EC  81 mg Oral Daily  . [MAR Hold] calcitRIOL  2.5 mcg Oral Q T,Th,Sa-HD  . [MAR Hold] calcium carbonate  1,250 mg Oral BID WC  . [MAR Hold] Chlorhexidine Gluconate Cloth  6 each Topical Daily  . [MAR Hold] darbepoetin (ARANESP) injection - DIALYSIS  150 mcg Intravenous Q Thu-HD  . [MAR Hold] heparin  5,000 Units Subcutaneous Q8H  . [MAR Hold] insulin aspart  0-5 Units Subcutaneous QHS  . [MAR Hold] insulin aspart  0-9 Units Subcutaneous TID WC  . [MAR Hold] isosorbide mononitrate  60 mg Oral Daily  . [MAR Hold] mupirocin ointment  1 application Nasal BID  . [MAR Hold] pantoprazole  40 mg Oral Daily

## 2018-07-11 NOTE — Progress Notes (Signed)
Patient wanted to sit upright in the bed. Assisted with son and patient lost consciousness about 8 seconds. Vitals remained soft. Alert and oriented x 3-4. Will continue to monitor.

## 2018-07-11 NOTE — Significant Event (Signed)
Rapid Response Event Note Called by nursing staff for difficult to arouse patient having periods of apnea post op  Overview: Time Called: 0025 Arrival Time: 0030 Event Type: Other (Comment)  Initial Focused Assessment: Upon arrival, [name] was arousable by physical stimulation, able to answer questions and then go immediately to sleep.  We were unable to keep him awake for longer than a minute.  HR 57, BP 90/42 (53), RR 17 sats 97%.  [name] had witnessed periods of apnea approximately 30 secs in length where he would drop his oxygen saturations to 80-85%.  Last fentanyl or narcotic was 1534. Son is at the bedside.  Interventions: -Narcan  Plan of Care (if not transferred): -Monitor for further apnea -May need to repeat Narcan if respiratory depression reoccurs  Event Summary: Pt stayed in room HR 62, BP 101/52 (67), RR 18 sats 100%. Pt is awake and communicating clearly.  No more peroids of apnea.  Call ended 0057  Roger Rollins

## 2018-07-11 NOTE — Progress Notes (Signed)
[facility] Progress Note  1 Day Post-Op  Subjective: CC-  Issues with hypotension and apneic episodes over night. He was given 1L fluid bolus and this initially improved his BP. Hemoglobin 6.5 and he was given 1 unit PRBCs. Repeat CBC pending this morning.  Getting narcan now.   Patient complaining of abdominal pain. No n/v this morning but he did have emesis x1 yesterday. Was tolerating liquids. States that he is passing flatus and had a BM postop.  Objective: Vital signs in last 24 hours: Temp:  [97.4 F (36.3 C)-98 F (36.7 C)] 97.4 F (36.3 C) (11/21 0445) Pulse Rate:  [46-62] 61 (11/21 0744) Resp:  [8-20] 18 (11/21 0744) BP: (81-148)/(42-65) 91/54 (11/21 0744) SpO2:  [91 %-100 %] 100 % (11/21 0744) Last BM Date: 07/09/18  Intake/Output from previous day: 11/20 0701 - 11/21 0700 In: 2132.5 [P.O.:120; I.V.:548.5; Blood:264; IV Piggyback:1200] Out: 10 [Blood:10] Intake/Output this shift: No intake/output data recorded.  PE: Gen:  Alert, NAD but tearful/anxious HEENT: EOM's intact, pupils equal and round Pulm:  effort normal Abd: Soft, ND, appropriately tender, +BS, lap incisions with cdi dressings Skin: no rashes noted, warm and dry  Lab Results:  Recent Labs    07/09/18 0734 07/10/18 1139 07/10/18 2243  WBC 6.2  --  7.2  HGB 8.4* 9.2* 6.5*  HCT 26.0* 27.0* 20.6*  PLT 135*  --  128*   BMET Recent Labs    07/10/18 2243 07/11/18 0616  NA 140 139  K 5.1 5.4*  CL 104 106  CO2 23 14*  GLUCOSE 146* 165*  BUN 37* 40*  CREATININE 7.79* 8.06*  CALCIUM 8.0* 7.4*   PT/INR Recent Labs    07/11/18 0056  LABPROT 17.0*  INR 1.40   CMP     Component Value Date/Time   NA 139 07/11/2018 0616   K 5.4 (H) 07/11/2018 0616   CL 106 07/11/2018 0616   CO2 14 (L) 07/11/2018 0616   GLUCOSE 165 (H) 07/11/2018 0616   BUN 40 (H) 07/11/2018 0616   CREATININE 8.06 (H) 07/11/2018 0616   CALCIUM 7.4 (L) 07/11/2018 0616   PROT 6.4 (L) 07/08/2018 1620    ALBUMIN 2.2 (L) 07/11/2018 0616   AST 24 07/08/2018 1620   ALT 34 07/08/2018 1620   ALKPHOS 71 07/08/2018 1620   BILITOT 0.8 07/08/2018 1620   GFRNONAA 7 (L) 07/11/2018 0616   GFRAA 8 (L) 07/11/2018 0616   Lipase     Component Value Date/Time   LIPASE 64 (H) 07/08/2018 1620       Studies/Results: Dg Cholangiogram Operative  Result Date: 07/10/2018 CLINICAL DATA:  Intraoperative cholangiogram during laparoscopic cholecystectomy. EXAM: INTRAOPERATIVE CHOLANGIOGRAM FLUOROSCOPY TIME:  23 seconds COMPARISON:  Right upper quadrant abdominal ultrasound-07/08/2018 FINDINGS: Intraoperative cholangiographic images of the right upper abdominal quadrant during laparoscopic cholecystectomy are provided for review. Surgical clips overlie the expected location of the gallbladder fossa. Contrast injection demonstrates selective cannulation of the central aspect of the cystic duct. There is passage of contrast through the central aspect of the cystic duct with filling of a non dilated common bile duct. There is passage of contrast though the CBD and into the descending portion of the duodenum. There is minimal reflux of injected contrast into the common hepatic duct and central aspect of the non dilated intrahepatic biliary system. There are no discrete filling defects within the opacified portions of the biliary system to suggest the presence of choledocholithiasis. IMPRESSION: No evidence of choledocholithiasis. Electronically Signed  By: Simonne Come M.D.   On: 07/10/2018 14:39    Anti-infectives: Anti-infectives (From admission, onward)   Start     Dose/Rate Route Frequency Ordered Stop   07/08/18 2315  metroNIDAZOLE (FLAGYL) IVPB 500 mg     500 mg 100 mL/hr over 60 Minutes Intravenous Every 8 hours 07/08/18 2056     07/08/18 2100  cefTRIAXone (ROCEPHIN) 2 g in sodium chloride 0.9 % 100 mL IVPB     2 g 200 mL/hr over 30 Minutes Intravenous Every 24 hours 07/08/18 2056          Assessment/Plan ESRD on HD Tues/Thurs/Sat  DM HFpEF HTN  Acute cholecystitis S/p lap chole with IOC 11/20 Dr. Corliss Skains - POD 1 - IOC negative - abdominal exam: appropriately tender, good bowel sounds  ID - rocephin/flagyl 11/18>> FEN - renal diet VTE - SCDs, sq heparin Foley - none  Plan: CBC pending. Abdominal exam reassuring. Diet as tolerated. Transfusion PRN primary. Patient does not need any more antibiotics from surgical standpoint.   LOS: 3 days    Franne Forts , [facility] 07/11/2018, 8:07 AM Pager: [phone] Mon 7:00 am -11:30 AM Tues-Fri 7:00 am-4:30 pm Sat-Sun 7:00 am-11:30 am

## 2018-07-11 NOTE — Anesthesia Procedure Notes (Signed)
Procedure Name: Intubation Date/Time: 07/11/2018 11:29 AM Performed by: Wilburn Cornelia, CRNA Pre-anesthesia Checklist: Patient identified, Emergency Drugs available, Suction available, Timeout performed and Patient being monitored Patient Re-evaluated:Patient Re-evaluated prior to induction Oxygen Delivery Method: Circle system utilized Preoxygenation: Pre-oxygenation with 100% oxygen Induction Type: IV induction Ventilation: Mask ventilation without difficulty Laryngoscope Size: Mac and 4 Grade View: Grade I Tube type: Oral Tube size: 7.5 mm Number of attempts: 1 Airway Equipment and Method: Stylet Placement Confirmation: ETT inserted through vocal cords under direct vision,  positive ETCO2,  CO2 detector and breath sounds checked- equal and bilateral Secured at: 22 cm Tube secured with: Tape Dental Injury: Teeth and Oropharynx as per pre-operative assessment

## 2018-07-11 NOTE — Anesthesia Procedure Notes (Signed)
Arterial Line Insertion Start/End11/21/2019 10:40 AM, 07/11/2018 10:47 AM Performed by: Izola Priceockfield, Miara Emminger Walton Jr., CRNA, CRNA  Patient location: Pre-op. Preanesthetic checklist: patient identified, IV checked, site marked, risks and benefits discussed, surgical consent, monitors and equipment checked, pre-op evaluation, timeout performed and anesthesia consent Lidocaine 1% used for infiltration Right, radial was placed Catheter size: 20 G Hand hygiene performed , maximum sterile barriers used  and Seldinger technique used Allen's test indicative of satisfactory collateral circulation Attempts: 1 Procedure performed without using ultrasound guided technique. Following insertion, dressing applied. Post procedure assessment: normal and unchanged  Patient tolerated the procedure well with no immediate complications.

## 2018-07-12 ENCOUNTER — Encounter (HOSPITAL_COMMUNITY): Payer: Self-pay | Admitting: Surgery

## 2018-07-12 LAB — CBC
HEMATOCRIT: 27.6 % — AB (ref 39.0–52.0)
HEMATOCRIT: 26.7 % — AB (ref 39.0–52.0)
HEMATOCRIT: 25.7 % — AB (ref 39.0–52.0)
HEMOGLOBIN: 9.4 g/dL — AB (ref 13.0–17.0)
Hemoglobin: 8.4 g/dL — ABNORMAL LOW (ref 13.0–17.0)
Hemoglobin: 8.8 g/dL — ABNORMAL LOW (ref 13.0–17.0)
MCH: 29.8 pg (ref 26.0–34.0)
MCH: 30.2 pg (ref 26.0–34.0)
MCH: 30.5 pg (ref 26.0–34.0)
MCHC: 32.7 g/dL (ref 30.0–36.0)
MCHC: 34.1 g/dL (ref 30.0–36.0)
MCHC: 33 g/dL (ref 30.0–36.0)
MCV: 89.6 fL (ref 80.0–100.0)
MCV: 90.5 fL (ref 80.0–100.0)
MCV: 92.4 fL (ref 80.0–100.0)
NRBC: 0 % (ref 0.0–0.2)
PLATELETS: 105 10*3/uL — AB (ref 150–400)
Platelets: 101 10*3/uL — ABNORMAL LOW (ref 150–400)
Platelets: 102 10*3/uL — ABNORMAL LOW (ref 150–400)
RBC: 2.78 MIL/uL — AB (ref 4.22–5.81)
RBC: 2.95 MIL/uL — ABNORMAL LOW (ref 4.22–5.81)
RBC: 3.08 MIL/uL — AB (ref 4.22–5.81)
RDW: 16.1 % — ABNORMAL HIGH (ref 11.5–15.5)
RDW: 15.8 % — ABNORMAL HIGH (ref 11.5–15.5)
RDW: 15.9 % — AB (ref 11.5–15.5)
WBC: 12.1 10*3/uL — AB (ref 4.0–10.5)
WBC: 13.6 10*3/uL — ABNORMAL HIGH (ref 4.0–10.5)
WBC: 13.6 10*3/uL — ABNORMAL HIGH (ref 4.0–10.5)
nRBC: 0.2 % (ref 0.0–0.2)
nRBC: 0.3 % — ABNORMAL HIGH (ref 0.0–0.2)

## 2018-07-12 LAB — POCT I-STAT 7, (LYTES, BLD GAS, ICA,H+H)
Acid-base deficit: 8 mmol/L — ABNORMAL HIGH (ref 0.0–2.0)
Bicarbonate: 19.1 mmol/L — ABNORMAL LOW (ref 20.0–28.0)
Calcium, Ion: 0.93 mmol/L — ABNORMAL LOW (ref 1.15–1.40)
HCT: 24 % — ABNORMAL LOW (ref 39.0–52.0)
HEMOGLOBIN: 8.2 g/dL — AB (ref 13.0–17.0)
O2 SAT: 100 %
PCO2 ART: 46.9 mmHg (ref 32.0–48.0)
PH ART: 7.217 — AB (ref 7.350–7.450)
POTASSIUM: 6 mmol/L — AB (ref 3.5–5.1)
Sodium: 139 mmol/L (ref 135–145)
TCO2: 20 mmol/L — ABNORMAL LOW (ref 22–32)
pO2, Arterial: 496 mmHg — ABNORMAL HIGH (ref 83.0–108.0)

## 2018-07-12 LAB — GLUCOSE, CAPILLARY
GLUCOSE-CAPILLARY: 158 mg/dL — AB (ref 70–99)
GLUCOSE-CAPILLARY: 129 mg/dL — AB (ref 70–99)
GLUCOSE-CAPILLARY: 138 mg/dL — AB (ref 70–99)
Glucose-Capillary: 151 mg/dL — ABNORMAL HIGH (ref 70–99)

## 2018-07-12 LAB — RENAL FUNCTION PANEL
Albumin: 3 g/dL — ABNORMAL LOW (ref 3.5–5.0)
Anion gap: 15 (ref 5–15)
BUN: 69 mg/dL — AB (ref 6–20)
CHLORIDE: 102 mmol/L (ref 98–111)
CO2: 20 mmol/L — AB (ref 22–32)
Calcium: 7.3 mg/dL — ABNORMAL LOW (ref 8.9–10.3)
Creatinine, Ser: 9.83 mg/dL — ABNORMAL HIGH (ref 0.61–1.24)
GFR calc Af Amer: 6 mL/min — ABNORMAL LOW (ref >60)
GFR calc non Af Amer: 5 mL/min — ABNORMAL LOW (ref >60)
GLUCOSE: 162 mg/dL — AB (ref 70–99)
POTASSIUM: 5.1 mmol/L (ref 3.5–5.1)
Phosphorus: 11.5 mg/dL — ABNORMAL HIGH (ref 2.5–4.6)
Sodium: 137 mmol/L (ref 135–145)

## 2018-07-12 LAB — BPAM PLATELET PHERESIS
BLOOD PRODUCT EXPIRATION DATE: 201911212359
ISSUE DATE / TIME: 201911211126
UNIT TYPE AND RH: 6200

## 2018-07-12 LAB — PREPARE PLATELET PHERESIS: Unit division: 0

## 2018-07-12 MED ORDER — DARBEPOETIN ALFA 150 MCG/0.3ML IJ SOSY
150.0000 ug | PREFILLED_SYRINGE | INTRAMUSCULAR | Status: DC
  Administered 2018-07-13: 150 ug via INTRAVENOUS
  Filled 2018-07-12: qty 0.3

## 2018-07-12 MED ORDER — HYDRALAZINE HCL 20 MG/ML IJ SOLN
INTRAMUSCULAR | Status: AC
  Filled 2018-07-12: qty 1

## 2018-07-12 MED ORDER — CLONIDINE HCL 0.1 MG PO TABS
0.1000 mg | ORAL_TABLET | Freq: Every day | ORAL | Status: DC
  Administered 2018-07-12 – 2018-07-13 (×2): 0.1 mg via ORAL
  Filled 2018-07-12 (×2): qty 1

## 2018-07-12 MED ORDER — HYDRALAZINE HCL 25 MG PO TABS
37.5000 mg | ORAL_TABLET | Freq: Three times a day (TID) | ORAL | Status: DC
  Administered 2018-07-12 – 2018-07-13 (×2): 37.5 mg via ORAL
  Filled 2018-07-12 (×3): qty 2

## 2018-07-12 MED ORDER — AMLODIPINE BESYLATE 10 MG PO TABS
10.0000 mg | ORAL_TABLET | Freq: Every day | ORAL | Status: DC
  Administered 2018-07-12 – 2018-07-13 (×2): 10 mg via ORAL
  Filled 2018-07-12 (×2): qty 1

## 2018-07-12 NOTE — Progress Notes (Signed)
Patient came back to unit from dialysis. He is alert and oriented. He denies any pain at this time. VS 179/70, HR 74, RR 19. Patient is resting comfortably in bed. I will continue to monitor.

## 2018-07-12 NOTE — Progress Notes (Addendum)
   Subjective: No acute events overnight. [name] is feeling much better this AM. He reports that he doesn't remember much of yesterdays events. Denies any fevers, chills, abdominal pain or shortness of breath. Pt has not yet had a bowel movement but has been passing gas. Encouraged him to ambulate as tolerated.   Objective:  Vital signs in last 24 hours: Vitals:   07/12/18 1030 07/12/18 1100 07/12/18 1130 07/12/18 1200  BP: (!) 201/83 (!) 189/83 (!) 200/88 (!) 208/87  Pulse: 64 64 65 67  Resp:      Temp:      TempSrc:      SpO2:      Weight:       Gen: Alert male sitting up in bed, talkative, appears comfortable, NAD CV: Regular rate and rhythm, 3/6 holosystolic murmur heard best at the upper sternal borders Pulm: CTAB, no increased WOB, no accessory muscle use Abdomen: Soft, non-distended, mild tenderness to palpation, dressings in place over lap chole incisions, no drainage or erythema noted, BS WNL Extremities: No pedal edema or erythema   Assessment/Plan: [name] is a very pleasant 57 y/o male with PMHx of DM, HTN, HF, GERD, and ESRD on Tu/Thurs/Sat dialysis who is POD#1 s/p lap cholecystectomy complicated by hemoperitoneum.    Active Problems:   ESRD (end stage renal disease) (HCC)   Abdominal pain   Cholecystitis, acute   Gallbladder & bile duct stone, acute cholecystitis and obstruction   Hemoperitoneum  Acute Cholecystitis s/p lap chole Acute blood loss anemia  POD#1:   3L hemoperitoneum evacuated in OR yesterday with no source of bleeding identified. S/p 3 units of PRBCs, 2 units FFP, and 1 unit of platelet. Pt had appropriate response with repeat hemoglobin increasing to 8.8 from 5.6. Has been hemodynamically stable since returning from OR for hemoperitoneum evacuation. Pt's blood pressures are now normo-hypertensive, stable. He is alert with mentation status returned to baseline. No signs or symptoms of active bleeding. Will continue to monitor  closely. - advance diet per surgery   Nausea/Vomiting: Resolved; no emesis since admission.   ESRD: on HD Tues/Thurs/Sat schedule.   - Appreciate Nephrology following for inpatient HD management; HD session today to make up for no session yesterday due to acute bleeding   Diabetes: well controlled; not currently on any medications outpatient  - SSI as inpatient   Normocytic Anemia: Baseline hemoglobin of 9 per pt report, acute blood loss following lap chole with hemoglobin drop to 5.6. S/p 3 units PRBCs, 2 units FFP 1 unit platelets with appropriate response to 8.8. Will check repeat hemoglobin today. Getting ESA and weekly IV Fe with HD.  - Monitor CBC  HFpEF: Chronic, stable. - Carvedilol 25 mg daily  HTN: Anti-hypertensives held yesterday in the setting of hypotension due to acute blood loss. Blood pressures now running 150s-200s/60s-80, will restart home regimen. - Amlodipine 10 mg daily - Clonidine 0.1mg  daily - Hydralazine 25 mg 1.5 tablets (37.5mg ) TID  Dispo: Anticipated discharge in approximately 1-2 day(s).   Kerby MoorsStoneking, Shelby E, Medical Student 07/12/2018, 12:07 PM Pager: [phone]

## 2018-07-12 NOTE — Progress Notes (Signed)
Subjective:  S/p re-exploration in the OR yesterday, 3L hemoperitoneum evacuated with no identifiable source.  This AM he is normotensive on dialysis and is tolerating everything well.  He received multiple blood products.  Hgb 8.8 this AM.    Objective Vital signs in last 24 hours: Vitals:   07/12/18 0630 07/12/18 0817 07/12/18 0831 07/12/18 0900  BP: (!) 151/64 (!) 169/80 (!) 156/76 (!) 166/79  Pulse:  73 69 68  Resp: 12     Temp:      TempSrc:  Oral    SpO2: 96% 97%    Weight:  70.7 kg     Weight change:   Physical Exam: General:alert, NAD  Heart:RRR 2/6 sem No r,g  Lungs:CTA Abdomen:BS pos, mildly diffusely tender, lap ports dressed with no blood  Extremities:no pedal edema Dialysis Access:pos bruit LUA AVF   OP Dialysis Orders: Center:[facility] 4 hr. EDW69kgHD Bath 2k, 2caHeparin NONE. AccessLUA AVF  Calcitriol 2.915mcg po /hd  Mircera 150 q 2wks (last given 07/04/18) hgb 9.0 07/04/18  Venofer 50 mg q weekly hd   Problem/Plan: 1. ESRD -HD  (TTS schedule).  Off schedule HD today and then tomorrow to get back on regular schedule.   2. Abd pain with cholecystitis on US - sp Cholecystectomy 11/20 complicated by hemoperitoneum of 3L, taken back to OR 11/21.     3. Hypertension/volume - holding BP meds, pressures better, vol removal to EDW by tomorrow HD. 4. Anemia of ESRD and acute blood loss anemia: s/p pRBCs, will give extra dose of ESA with HD 5. Metabolic bone disease -Po vit d on hd , binders when po diet ,  6. DM type 2 - per admit  Bufford ButtnerElizabeth Suhaila Troiano MD [facility] pgr [phone] 07/12/2018,9:32 AM  LOS: 4 days   Labs: Basic Metabolic Panel: Recent Labs  Lab 07/09/18 0450 07/10/18 1139 07/10/18 2243 07/11/18 0616 07/11/18 1228  NA 140 141 140 139 139  K 5.2* 4.0 5.1 5.4* 6.0*  CL 102  --  104 106  --   CO2 22  --  23 14*  --   GLUCOSE 82 90 146* 165*  --   BUN 80*  --  37* 40*  --   CREATININE  10.74*  --  7.79* 8.06*  --   CALCIUM 8.9  --  8.0* 7.4*  --   PHOS 6.3*  --  8.5* 10.3*  --    Liver Function Tests: Recent Labs  Lab 07/08/18 1620 07/09/18 0450 07/10/18 2243 07/11/18 0616  AST 24  --   --   --   ALT 34  --   --   --   ALKPHOS 71  --   --   --   BILITOT 0.8  --   --   --   PROT 6.4*  --   --   --   ALBUMIN 3.6 3.3* 2.6* 2.2*   Recent Labs  Lab 07/08/18 1620  LIPASE 64*   No results for input(s): AMMONIA in the last 168 hours. CBC: Recent Labs  Lab 07/08/18 1620 07/09/18 0734  07/10/18 2243 07/11/18 0616 07/11/18 1228 07/11/18 2354  WBC 6.8 6.2  --  7.2 5.6  --  13.6*  NEUTROABS 5.7 4.9  --   --   --   --   --   HGB 8.6* 8.4*   < > 6.5* 5.6* 8.2* 8.8*  HCT 28.1* 26.0*   < > 20.6* 18.0* 24.0* 26.7*  MCV 98.6 96.7  --  100.0 100.0  --  90.5  PLT 147* 135*  --  128* 103*  --  101*   < > = values in this interval not displayed.   Cardiac Enzymes: No results for input(s): CKTOTAL, CKMB, CKMBINDEX, TROPONINI in the last 168 hours. CBG: Recent Labs  Lab 07/11/18 0733 07/11/18 1000 07/11/18 1338 07/11/18 2045 07/12/18 0611  GLUCAP 130* 114* 114* 163* 151*    Studies/Results: Dg Cholangiogram Operative  Result Date: 07/10/2018 CLINICAL DATA:  Intraoperative cholangiogram during laparoscopic cholecystectomy. EXAM: INTRAOPERATIVE CHOLANGIOGRAM FLUOROSCOPY TIME:  23 seconds COMPARISON:  Right upper quadrant abdominal ultrasound-07/08/2018 FINDINGS: Intraoperative cholangiographic images of the right upper abdominal quadrant during laparoscopic cholecystectomy are provided for review. Surgical clips overlie the expected location of the gallbladder fossa. Contrast injection demonstrates selective cannulation of the central aspect of the cystic duct. There is passage of contrast through the central aspect of the cystic duct with filling of a non dilated common bile duct. There is passage of contrast though the CBD and into the descending portion of the  duodenum. There is minimal reflux of injected contrast into the common hepatic duct and central aspect of the non dilated intrahepatic biliary system. There are no discrete filling defects within the opacified portions of the biliary system to suggest the presence of choledocholithiasis. IMPRESSION: No evidence of choledocholithiasis. Electronically Signed   By: Simonne Come M.D.   On: 07/10/2018 14:39   Medications: . sodium chloride 10 mL/hr at 07/10/18 1145  . cefTRIAXone (ROCEPHIN)  IV 2 g (07/11/18 2044)  . ferric gluconate (FERRLECIT/NULECIT) IV    . metronidazole 500 mg (07/12/18 0522)   . sodium chloride   Intravenous Once  . calcitRIOL  2.5 mcg Oral Q T,Th,Sa-HD  . calcium carbonate  1,250 mg Oral BID WC  . Chlorhexidine Gluconate Cloth  6 each Topical Daily  . [START ON 07/18/2018] darbepoetin (ARANESP) injection - DIALYSIS  150 mcg Intravenous Q Thu-HD  . insulin aspart  0-5 Units Subcutaneous QHS  . insulin aspart  0-9 Units Subcutaneous TID WC  . isosorbide mononitrate  60 mg Oral Daily  . mupirocin ointment  1 application Nasal BID  . pantoprazole  40 mg Oral Daily

## 2018-07-12 NOTE — Progress Notes (Signed)
1 Day Post-Op  Subjective: Feeling much better.  Hungry and wants to eat. States he is scheduled for dialysis today. Minimal abdominal pain Normotensive.  Heart rate 67. Last hemoglobin 8.8 at midnight last night.  Awaiting labs and dialysis this morning.  Objective: Vital signs in last 24 hours: Temp:  [97.3 F (36.3 C)-98.2 F (36.8 C)] 98 F (36.7 C) (11/22 0523) Pulse Rate:  [56-67] 67 (11/22 0315) Resp:  [11-20] 12 (11/22 0630) BP: (93-185)/(51-76) 151/64 (11/22 0630) SpO2:  [90 %-100 %] 96 % (11/22 0630) Arterial Line BP: (157-215)/(57-152) 162/122 (11/22 0630) Weight:  [70.4 kg] 70.4 kg (11/22 0532) Last BM Date: 07/10/18  Intake/Output from previous day: 11/21 0701 - 11/22 0700 In: 3059 [I.V.:1250; Blood:1809] Out: 0  Intake/Output this shift: No intake/output data recorded.  General appearance: Alert.  Pleasant.  Jovial.  Wife present.  No distress Resp: clear to auscultation bilaterally GI: Abdomen soft and nondistended.  Trocar sites look fine.  Minimal tenderness right upper quadrant.  Lab Results:  Results for orders placed or performed during the hospital encounter of 07/08/18 (from the past 24 hour(s))  Prepare RBC     Status: None   Collection Time: 07/11/18  9:22 AM  Result Value Ref Range   Order Confirmation      ORDER PROCESSED BY BLOOD BANK Performed at [facility], [address] [place], [place] [address]   Glucose, capillary     Status: Abnormal   Collection Time: 07/11/18 10:00 AM  Result Value Ref Range   Glucose-Capillary 114 (H) 70 - 99 mg/dL  Prepare RBC     Status: None   Collection Time: 07/11/18 10:23 AM  Result Value Ref Range   Order Confirmation      ORDER PROCESSED BY BLOOD BANK Performed at [facility], [address] [place], [place] [address]   Prepare RBC     Status: None   Collection Time: 07/11/18 10:54 AM  Result Value Ref Range   Order Confirmation      ORDER PROCESSED BY BLOOD BANK Performed at  [facility], [address] [place], [place] [address]   Prepare Pheresed Platelets     Status: None (Preliminary result)   Collection Time: 07/11/18 11:24 AM  Result Value Ref Range   Unit Number [id number]    Blood Component Type PLTP LR1 PAS    Unit division 00    Status of Unit ISSUED    Transfusion Status      OK TO TRANSFUSE Performed at [facility], [address] [place], [place] [address]   Glucose, capillary     Status: Abnormal   Collection Time: 07/11/18  1:38 PM  Result Value Ref Range   Glucose-Capillary 114 (H) 70 - 99 mg/dL  Prepare fresh frozen plasma     Status: None (Preliminary result)   Collection Time: 07/11/18  2:44 PM  Result Value Ref Range   Unit Number [id number]    Blood Component Type THAWED PLASMA    Unit division 00    Status of Unit ISSUED    Transfusion Status OK TO TRANSFUSE    Unit Number [id number]    Blood Component Type THAWED PLASMA    Unit division 00    Status of Unit ISSUED    Transfusion Status      OK TO TRANSFUSE Performed at [facility], [address] [place], [place] [address]   Glucose, capillary  Status: Abnormal   Collection Time: 07/11/18  8:45 PM  Result Value Ref Range   Glucose-Capillary 163 (H) 70 - 99 mg/dL  CBC     Status: Abnormal   Collection Time: 07/11/18 11:54 PM  Result Value Ref Range   WBC 13.6 (H) 4.0 - 10.5 K/uL   RBC 2.95 (L) 4.22 - 5.81 MIL/uL   Hemoglobin 8.8 (L) 13.0 - 17.0 g/dL   HCT 16.1 (L) 09.6 - 04.5 %   MCV 90.5 80.0 - 100.0 fL   MCH 29.8 26.0 - 34.0 pg   MCHC 33.0 30.0 - 36.0 g/dL   RDW 40.9 (H) 81.1 - 91.4 %   Platelets 101 (L) 150 - 400 K/uL   nRBC 0.0 0.0 - 0.2 %  Glucose, capillary     Status: Abnormal   Collection Time: 07/12/18  6:11 AM  Result Value Ref Range   Glucose-Capillary 151 (H) 70 - 99 mg/dL     Studies/Results: No results found.  . sodium chloride   Intravenous Once  . calcitRIOL  2.5 mcg Oral Q T,Th,Sa-HD  . calcium  carbonate  1,250 mg Oral BID WC  . Chlorhexidine Gluconate Cloth  6 each Topical Daily  . [START ON 07/18/2018] darbepoetin (ARANESP) injection - DIALYSIS  150 mcg Intravenous Q Thu-HD  . insulin aspart  0-5 Units Subcutaneous QHS  . insulin aspart  0-9 Units Subcutaneous TID WC  . isosorbide mononitrate  60 mg Oral Daily  . mupirocin ointment  1 application Nasal BID  . pantoprazole  40 mg Oral Daily     Assessment/Plan: s/p Procedure(s): DIAGNOSTIC LAPAROSCOPY; EVACUATION OF HEMOPERITONEUM  POD #1.  Laparoscopy with evacuation of hemoperitoneum POD #2.   Laparoscopic cholecystectomy with cholangiogram  Patient seems stable with no evidence of ongoing bleeding Will advance to renal diet with fluid restriction Ambulate as much as possible Dialysis today Check labs tomorrow to ensure stability If no further bleeding then possibly discharge this weekend from surgical standpoint  @PROBHOSP @  LOS: 4 days    Ernestene Mention 07/12/2018  . .prob

## 2018-07-12 NOTE — Procedures (Signed)
Patient seen and examined on Hemodialysis. QB400 mL/ min via AVF UF goal 2L.  Tolerating HD well.  Minimal abd pain.    Treatment adjusted as needed.  Bufford ButtnerElizabeth Meko Bellanger MD [facility] pgr [phone] 9:39 AM

## 2018-07-12 NOTE — Plan of Care (Signed)
  Problem: Education: Goal: Knowledge of General Education information will improve Description Including pain rating scale, medication(s)/side effects and non-pharmacologic comfort measures Outcome: Progressing   Problem: Health Behavior/Discharge Planning: Goal: Ability to manage health-related needs will improve Outcome: Progressing   Problem: Clinical Measurements: Goal: Diagnostic test results will improve Outcome: Progressing   Problem: Education: Goal: Knowledge of disease and its progression will improve Outcome: Progressing

## 2018-07-13 DIAGNOSIS — Z91018 Allergy to other foods: Secondary | ICD-10-CM

## 2018-07-13 DIAGNOSIS — K8001 Calculus of gallbladder with acute cholecystitis with obstruction: Secondary | ICD-10-CM

## 2018-07-13 LAB — BASIC METABOLIC PANEL
Anion gap: 9 (ref 5–15)
BUN: 30 mg/dL — ABNORMAL HIGH (ref 6–20)
CALCIUM: 8.1 mg/dL — AB (ref 8.9–10.3)
CHLORIDE: 98 mmol/L (ref 98–111)
CO2: 29 mmol/L (ref 22–32)
CREATININE: 6.37 mg/dL — AB (ref 0.61–1.24)
GFR calc non Af Amer: 9 mL/min — ABNORMAL LOW (ref >60)
GFR, EST AFRICAN AMERICAN: 10 mL/min — AB (ref >60)
Glucose, Bld: 118 mg/dL — ABNORMAL HIGH (ref 70–99)
Potassium: 4.7 mmol/L (ref 3.5–5.1)
SODIUM: 136 mmol/L (ref 135–145)

## 2018-07-13 LAB — BPAM FFP
BLOOD PRODUCT EXPIRATION DATE: 201911232359
Blood Product Expiration Date: 201911232359
ISSUE DATE / TIME: 201911211726
ISSUE DATE / TIME: 201911220308
Unit Type and Rh: 6200
Unit Type and Rh: 6200

## 2018-07-13 LAB — CBC
HCT: 25.9 % — ABNORMAL LOW (ref 39.0–52.0)
HEMOGLOBIN: 8.3 g/dL — AB (ref 13.0–17.0)
MCH: 29.9 pg (ref 26.0–34.0)
MCHC: 32 g/dL (ref 30.0–36.0)
MCV: 93.2 fL (ref 80.0–100.0)
Platelets: 111 10*3/uL — ABNORMAL LOW (ref 150–400)
RBC: 2.78 MIL/uL — ABNORMAL LOW (ref 4.22–5.81)
RDW: 15.6 % — ABNORMAL HIGH (ref 11.5–15.5)
WBC: 9.5 10*3/uL (ref 4.0–10.5)
nRBC: 0.5 % — ABNORMAL HIGH (ref 0.0–0.2)

## 2018-07-13 LAB — GLUCOSE, CAPILLARY
GLUCOSE-CAPILLARY: 173 mg/dL — AB (ref 70–99)
Glucose-Capillary: 106 mg/dL — ABNORMAL HIGH (ref 70–99)
Glucose-Capillary: 88 mg/dL (ref 70–99)

## 2018-07-13 LAB — PREPARE FRESH FROZEN PLASMA
UNIT DIVISION: 0
Unit division: 0

## 2018-07-13 MED ORDER — DARBEPOETIN ALFA 150 MCG/0.3ML IJ SOSY
PREFILLED_SYRINGE | INTRAMUSCULAR | Status: AC
  Filled 2018-07-13: qty 0.3

## 2018-07-13 NOTE — Discharge Instructions (Signed)
Thank you for allowing [place] to provide your care. Please follow-up with surgery, your primary care doctor, and your kidney doctor. Continue all your medications as prescribed.   [facility]  Please arrive at least 30 min before your appointment to complete your check in paperwork.  If you are unable to arrive 30 min prior to your appointment time we may have to cancel or reschedule you. LAPAROSCOPIC SURGERY: POST OP INSTRUCTIONS Always review your discharge instruction sheet given to you by the facility where your surgery was performed. IF YOU HAVE DISABILITY OR FAMILY LEAVE FORMS, YOU MUST BRING THEM TO THE OFFICE FOR PROCESSING.   DO NOT GIVE THEM TO YOUR DOCTOR.  PAIN CONTROL  1. First take acetaminophen (Tylenol) AND/or ibuprofen (Advil) to control your pain after surgery.  Follow directions on package.  Taking acetaminophen (Tylenol) and/or ibuprofen (Advil) regularly after surgery will help to control your pain and lower the amount of prescription pain medication you may need.  You should not take more than 4,000 mg (4 grams) of acetaminophen (Tylenol) in 24 hours.  You should not take ibuprofen (Advil), aleve, motrin, naprosyn or other NSAIDS if you have a history of stomach ulcers or chronic kidney disease.  2. A prescription for pain medication may be given to you upon discharge.  Take your pain medication as prescribed, if you still have uncontrolled pain after taking acetaminophen (Tylenol) or ibuprofen (Advil). 3. Use ice packs to help control pain. 4. If you need a refill on your pain medication, please contact your pharmacy.  They will contact our office to request authorization. Prescriptions will not be filled after 5pm or on week-ends.  HOME MEDICATIONS 5. Take your usually prescribed medications unless otherwise directed.  DIET 6. You should follow a light diet the first few days after arrival home.  Be sure to include lots of fluids daily. Avoid fatty,  fried foods.   CONSTIPATION 7. It is common to experience some constipation after surgery and if you are taking pain medication.  Increasing fluid intake and taking a stool softener (such as Colace) will usually help or prevent this problem from occurring.  A mild laxative (Milk of Magnesia or Miralax) should be taken according to package instructions if there are no bowel movements after 48 hours.  WOUND/INCISION CARE 8. Most patients will experience some swelling and bruising in the area of the incisions.  Ice packs will help.  Swelling and bruising can take several days to resolve.  9. Unless discharge instructions indicate otherwise, follow guidelines below  a. STERI-STRIPS - you may remove your outer bandages 48 hours after surgery, and you may shower at that time.  You have steri-strips (small skin tapes) in place directly over the incision.  These strips should be left on the skin for 7-10 days.   b. DERMABOND/SKIN GLUE - you may shower in 24 hours.  The glue will flake off over the next 2-3 weeks. 10. Any sutures or staples will be removed at the office during your follow-up visit.  ACTIVITIES 11. You may resume regular (light) daily activities beginning the next day--such as daily self-care, walking, climbing stairs--gradually increasing activities as tolerated.  You may have sexual intercourse when it is comfortable.  Refrain from any heavy lifting or straining until approved by your doctor. a. You may drive when you are no longer taking prescription pain medication, you can comfortably wear a seatbelt, and you can safely maneuver your car and apply brakes.  FOLLOW-UP  12. You should see your doctor in the office for a follow-up appointment approximately 2-3 weeks after your surgery.  You should have been given your post-op/follow-up appointment when your surgery was scheduled.  If you did not receive a post-op/follow-up appointment, make sure that you call for this appointment within a day  or two after you arrive home to insure a convenient appointment time.   WHEN TO CALL YOUR DOCTOR: 1. Fever over 101.0 2. Inability to urinate 3. Continued bleeding from incision. 4. Increased pain, redness, or drainage from the incision. 5. Increasing abdominal pain  The clinic staff is available to answer your questions during regular business hours.  Please dont hesitate to call and ask to speak to one of the nurses for clinical concerns.  If you have a medical emergency, go to the nearest emergency room or call 911.  A surgeon from [facility] is always on call at the hospital. [address], [address], [place], [place]  [address] ? [address] [address], [place], [place]   [address] [phone] ? [phone] ? FAX [phone]

## 2018-07-13 NOTE — Discharge Summary (Signed)
Name: Keelan Tripodi MRN: [id number] DOB: 10/22/1960 57 y.o. PCP: Arnetha Courser, MD  Date of Admission: 07/08/2018  5:29 PM Date of Discharge: 07/13/2018 Attending Physician: Burns Spain, MD  Discharge Diagnosis: Acute cholecystitis complicated by hemoperitoneum   Discharge Medications: Allergies as of 07/13/2018      Reactions   Onion Nausea And Vomiting, Other (See Comments), Hypertension   Migraines, also      Medication List    TAKE these medications   acetaminophen 650 MG CR tablet Commonly known as:  TYLENOL Take 1 tablet (650 mg total) by mouth every 8 (eight) hours as needed for pain.   amLODipine 10 MG tablet Commonly known as:  NORVASC Take 1 tablet (10 mg total) by mouth daily. IM Program.   aspirin EC 81 MG tablet Take 81 mg by mouth daily.   calcium carbonate 1250 (500 Ca) MG chewable tablet Commonly known as:  OS-CAL Chew 1 tablet by mouth 2 (two) times daily.   carvedilol 25 MG tablet Commonly known as:  COREG Take 1 tablet (25 mg total) by mouth 2 (two) times daily with a meal. IM Program.   cloNIDine 0.1 MG tablet Commonly known as:  CATAPRES Take 1 tablet (0.1 mg total) by mouth 2 (two) times daily.   ferric citrate 1 GM 210 MG(Fe) tablet Commonly known as:  AURYXIA Take 2 tablets (420 mg total) by mouth 3 (three) times daily with meals.   hydrALAZINE 25 MG tablet Commonly known as:  APRESOLINE Take 1.5 tablets (37.5 mg total) by mouth 3 (three) times daily.   isosorbide mononitrate 60 MG 24 hr tablet Commonly known as:  IMDUR Take 1 tablet (60 mg total) by mouth every morning.   Lido-Capsaicin-Men-Methyl Sal 0.5-0.035-5-20 % Ptch Apply 1 patch topically 3 (three) times daily as needed. What changed:  reasons to take this   omeprazole 20 MG capsule Commonly known as:  PRILOSEC Take 1 capsule (20 mg total) by mouth 2 (two) times daily before a meal. IM Program.     Disposition and follow-up:   Mr.Endi Hinckley  was discharged from [facility] in Stable condition.  At the hospital follow up visit please address:  1. Acute cholecystitis complicated by hemoperitoneum. Ensure the patient has followed up with surgery and recheck a CBC.   2.  Labs / imaging needed at time of follow-up: CBC  3.  Pending labs/ test needing follow-up: None  Follow-up Appointments: Follow-up Information    [facility] Follow up in 2 week(s).   Specialty:  General Surgery Why:  our office will call you with appointment date and time Contact information: [address] [place] [address] [phone]        Arnetha Courser, MD Follow up.   Specialty:  Internal Medicine Contact information: [address] [place] [address] [phone]         Hospital Course by problem list:  Acute cholecystitis complicated by hemoperitoneum. Witten Certain is a 57 y.o male with ESRD, DM, and HTN who presented with acute RUQ abdominal pain and found to have acute cholecystitis. Surgery was consulted and the patient was subsequently taken for lap cholecystectomy on 07/10/2018. Surgery was uncomplicated; however, postoperative course was complicated by acute hypotension and down trending hemoglobin. Patient was subsequently taken back for exploratory laparoscopy on 11/21 and found to have an hemoperitoneum. Approximately 3 L of bloody fluid was removed from the abdomen. No source of the bleeding was identified however  surgical snow was applied to the previous site of cholecystectomy. Postoperatively the patient stabilized and his blood pressure returned to normal ranges. For his acute hemorrhage the patient was treated with three units of packed red cells, two units of fresh frozen plasma, and one unit of platelets. His hemoglobin responded appropriately and increased from 5.6 to 8.8. The patient's hemoglobin was treated for two days and found to be stable at approximately 8 to 9. He was  discharged with surgery follow-up scheduled.  ESRD. Patient resumed his normal HD schedule prior to discharge. No medications were changed during his hospitalization.   Discharge Vitals:   BP (!) 166/68 (BP Location: Right Arm)   Pulse 71   Temp 98 F (36.7 C) (Oral)   Resp 17   Ht 5' 9.5" (1.765 m)   Wt 68.1 kg Comment: stood to scale   SpO2 98%   BMI 21.85 kg/m   Pertinent Labs, Studies, and Procedures:  CBC Latest Ref Rng & Units 07/13/2018 07/12/2018 07/12/2018  WBC 4.0 - 10.5 K/uL 9.5 12.1(H) 13.6(H)  Hemoglobin 13.0 - 17.0 g/dL 8.3(L) 9.4(L) 8.4(L)  Hematocrit 39.0 - 52.0 % 25.9(L) 27.6(L) 25.7(L)  Platelets 150 - 400 K/uL 111(L) 102(L) 105(L)   Discharge Instructions: Discharge Instructions    Diet - low sodium heart healthy   Complete by:  As directed    Increase activity slowly   Complete by:  As directed     Signed: Levora DredgeHelberg, Lavenia Stumpo, MD 07/13/2018, 11:56 AM

## 2018-07-13 NOTE — Progress Notes (Signed)
   Subjective: No acute events overnight. Pt reports that he is eating, drinking, and ambulating without difficulty. Has not had a BM yet but passing a lot of flatus. Endorses mild abdominal soreness, no outright pain. Feels well this AM, no further concerns.  Objective:  Vital signs in last 24 hours: Vitals:   07/12/18 1952 07/12/18 1957 07/13/18 0025 07/13/18 0556  BP: 118/61  (!) 147/69 (!) 161/67  Pulse: 70     Resp: 14  19 (!) 28  Temp: 98.2 F (36.8 C)  97.9 F (36.6 C) 98.1 F (36.7 C)  TempSrc: Oral  Oral Oral  SpO2: 96%  95% 96%  Weight:    69.9 kg  Height:  5' 9.5" (1.765 m)     Gen: Alert male lying up in bed undergoing dialysis, alert, NAD, answers questions appropriately CV: Regular rate and rhythm, 3/6 holosystolic murmur heard best at the upper sternal borders Pulm: CTAB, no increased WOB, no accessory muscle use Abdomen: Soft, non-distended, mild tenderness to palpation, dressings in place over lap chole incisions, no drainage or erythema noted, BS WNL Extremities: No pedal edema or erythema   Assessment/Plan: [name] is a very pleasant 57 y/o male with PMHx of DM, HTN, HF, GERD, and ESRD on Tu/Thurs/Sat dialysis who is POD#2 s/p lap cholecystectomy complicated by hemoperitoneum.    Principal Problem:   Gallbladder & bile duct stone, acute cholecystitis and obstruction Active Problems:   ESRD (end stage renal disease) (HCC)   Abdominal pain   Cholecystitis, acute   Hemoperitoneum  Acute Cholecystitis s/p lap chole Acute blood loss anemia:   POD#2 following evacuation of 3L hemoperitoneum requiring 3 units of PRBCs, 2 units FFP, and 1 unit of platelet. Hemodynamically stable with hemoglobin of 8.3, blood pressures normo-hypertensive (baseline). No signs or symptoms of active bleeding. Denies abdominal pain. Ambulating and tolerating PO without difficulty. Will discharge home with close outpatient follow up with surgery.   Nausea/Vomiting: Resolved; no  emesis since admission.   ESRD: on HD Tues/Thurs/Sat schedule.   - Appreciate Nephrology following for inpatient HD management; HD session today   Diabetes: well controlled; not currently on any medications outpatient   Normocytic Anemia: Baseline hemoglobin of 9 per pt report, acute blood loss following lap chole with hemoglobin drop to 5.6. S/p 3 units PRBCs, 2 units FFP 1 unit platelets with appropriate response to 8.8, stable at 8.3 today. Getting ESA and weekly IV Fe with HD. Will discharge home with close outpatient follow up.  HFpEF: Chronic, stable. - Carvedilol 25 mg daily  HTN: Chronic, stable. - Amlodipine 10 mg daily - Clonidine 0.1mg  daily - Hydralazine 25 mg 1.5 tablets (37.5mg ) TID  Dispo: Anticipated discharge in approximately 0-1 day(s).   Roger Rollins, Roger Rollins, Medical Student 07/13/2018, 6:56 AM Pager: [phone]

## 2018-07-13 NOTE — Progress Notes (Signed)
Patient ID: Roger Rollins, male   DOB: [birth year], 57 y.o.   MRN: [id number]    2 Days Post-Op  Subjective: Patient feels very well today.  In HD.  No complaints.  Eating well.  Passing lots of flatus.  No BM yet.  Objective: Vital signs in last 24 hours: Temp:  [97.9 F (36.6 C)-98.3 F (36.8 C)] 98.1 F (36.7 C) (11/23 0556) Pulse Rate:  [64-73] 70 (11/22 1952) Resp:  [12-28] 28 (11/23 0556) BP: (118-208)/(61-88) 161/67 (11/23 0556) SpO2:  [95 %-98 %] 96 % (11/23 0556) Weight:  [69.2 kg-70.7 kg] 69.9 kg (11/23 0556) Last BM Date: 07/11/18  Intake/Output from previous day: 11/22 0701 - 11/23 0700 In: -  Out: 1500  Intake/Output this shift: No intake/output data recorded.  PE: Abd: soft, minimally tender around incisions, +BS, ND, incisions c/d/i with dressings in place.  Lab Results:  Recent Labs    07/12/18 1416 07/13/18 0339  WBC 12.1* 9.5  HGB 9.4* 8.3*  HCT 27.6* 25.9*  PLT 102* 111*   BMET Recent Labs    07/12/18 0743 07/13/18 0339  NA 137 136  K 5.1 4.7  CL 102 98  CO2 20* 29  GLUCOSE 162* 118*  BUN 69* 30*  CREATININE 9.83* 6.37*  CALCIUM 7.3* 8.1*   PT/INR Recent Labs    07/11/18 0056  LABPROT 17.0*  INR 1.40   CMP     Component Value Date/Time   NA 136 07/13/2018 0339   K 4.7 07/13/2018 0339   CL 98 07/13/2018 0339   CO2 29 07/13/2018 0339   GLUCOSE 118 (H) 07/13/2018 0339   BUN 30 (H) 07/13/2018 0339   CREATININE 6.37 (H) 07/13/2018 0339   CALCIUM 8.1 (L) 07/13/2018 0339   PROT 6.4 (L) 07/08/2018 1620   ALBUMIN 3.0 (L) 07/12/2018 0743   AST 24 07/08/2018 1620   ALT 34 07/08/2018 1620   ALKPHOS 71 07/08/2018 1620   BILITOT 0.8 07/08/2018 1620   GFRNONAA 9 (L) 07/13/2018 0339   GFRAA 10 (L) 07/13/2018 0339   Lipase     Component Value Date/Time   LIPASE 64 (H) 07/08/2018 1620       Studies/Results: No results found.  Anti-infectives: Anti-infectives (From admission, onward)   Start     Dose/Rate Route  Frequency Ordered Stop   07/08/18 2315  metroNIDAZOLE (FLAGYL) IVPB 500 mg  Status:  Discontinued     500 mg 100 mL/hr over 60 Minutes Intravenous Every 8 hours 07/08/18 2056 07/12/18 0956   07/08/18 2100  cefTRIAXone (ROCEPHIN) 2 g in sodium chloride 0.9 % 100 mL IVPB  Status:  Discontinued     2 g 200 mL/hr over 30 Minutes Intravenous Every 24 hours 07/08/18 2056 07/12/18 0956       Assessment/Plan ESRD on HD Tues/Thurs/Sat  DM HFpEF HTN  Acute cholecystitis S/p lap chole with IOC 11/20 Dr. Corliss Skains, take back on 11-21 for hemoperitoneum and evacuation, Dr. Corliss Skains - POD 2,3 - hgb is essentially stable.  plts coming back up. -passing lots of flatus and tolerating a diet -mobilize and pulm toilet -patient is surgically stable for DC home when medically stable.  Follow up is being arranged by our office and will contact him with appointment information.  ID - rocephin/flagyl 11/18>> 11/20 FEN - renal diet VTE - SCDs Foley - none  Plan: CBC pending. Abdominal exam reassuring. Diet as tolerated. Transfusion PRN primary. Patient does not need any more antibiotics from surgical standpoint.  LOS: 5 days  Letha CapeKelly E Katai Marsico , [facility] 07/13/2018, 8:13 AM Pager: [phone]

## 2018-07-13 NOTE — Progress Notes (Signed)
Discharged to home with family office visits in place teaching done  

## 2018-07-13 NOTE — Progress Notes (Signed)
Subjective:  Stable , good BP's, Hb mid 8's today  Objective Vital signs in last 24 hours: Vitals:   07/13/18 1000 07/13/18 1030 07/13/18 1037 07/13/18 1125  BP: (!) 174/77 (!) 168/75 (!) 166/68   Pulse: 69 69 71   Resp:   17   Temp:   98 F (36.7 C)   TempSrc:   Oral   SpO2:   98%   Weight:    68.1 kg  Height:       Weight change: 0.3 kg  Physical Exam: General:alert, NAD  Heart:RRR 2/6 sem No r,g  Lungs:CTA Abdomen:BS pos, mildly diffusely tender, lap ports dressed with no blood  Extremities:no pedal edema Dialysis Access:pos bruit LUA AVF   OP Dialysis Orders: Center:[facility] TTS 4 hr. EDW69kgHD Bath 2k, 2caHeparin NONE. AccessLUA AVF  Calcitriol 2.15mcg po /hd  Mircera 150 q 2wks (last given 07/04/18) hgb 9.0 07/04/18  Venofer 50 mg q weekly hd   Problem/Plan: 1. ESRD -HD  (TTS schedule). HD this am w/o incident. Pt going home.  2. Abd pain with cholecystitis on US - sp Cholecystectomy 11/20 complicated by hemoperitoneum of 3L, taken back to OR 11/21.     3. Hypertension/volume - low BP's resolved. BP's up. Resume BP meds at dc.  4. Anemia of ESRD+ ABL: s/p pRBCs, gave extra dose of ESA 150 today 11/23 5. Metabolic bone disease -Po vit d on hd , binders when po diet ,  6. DM type 2 - per admit   Vinson Moselleob Carmell Elgin MD [facility] pager [phone]   07/13/2018, 1:18 PM    Labs: Basic Metabolic Panel: Recent Labs  Lab 07/10/18 2243 07/11/18 0616 07/11/18 1228 07/12/18 0743 07/13/18 0339  NA 140 139 139 137 136  K 5.1 5.4* 6.0* 5.1 4.7  CL 104 106  --  102 98  CO2 23 14*  --  20* 29  GLUCOSE 146* 165*  --  162* 118*  BUN 37* 40*  --  69* 30*  CREATININE 7.79* 8.06*  --  9.83* 6.37*  CALCIUM 8.0* 7.4*  --  7.3* 8.1*  PHOS 8.5* 10.3*  --  11.5*  --    Liver Function Tests: Recent Labs  Lab 07/08/18 1620  07/10/18 2243 07/11/18 0616 07/12/18 0743  AST 24  --   --   --   --   ALT 34  --   --   --    --   ALKPHOS 71  --   --   --   --   BILITOT 0.8  --   --   --   --   PROT 6.4*  --   --   --   --   ALBUMIN 3.6   < > 2.6* 2.2* 3.0*   < > = values in this interval not displayed.   Recent Labs  Lab 07/08/18 1620  LIPASE 64*   No results for input(s): AMMONIA in the last 168 hours. CBC: Recent Labs  Lab 07/08/18 1620 07/09/18 0734  07/11/18 0616  07/11/18 2354 07/12/18 0743 07/12/18 1416 07/13/18 0339  WBC 6.8 6.2   < > 5.6  --  13.6* 13.6* 12.1* 9.5  NEUTROABS 5.7 4.9  --   --   --   --   --   --   --   HGB 8.6* 8.4*   < > 5.6*   < > 8.8* 8.4* 9.4* 8.3*  HCT 28.1* 26.0*   < > 18.0*   < >  26.7* 25.7* 27.6* 25.9*  MCV 98.6 96.7   < > 100.0  --  90.5 92.4 89.6 93.2  PLT 147* 135*   < > 103*  --  101* 105* 102* 111*   < > = values in this interval not displayed.   Cardiac Enzymes: No results for input(s): CKTOTAL, CKMB, CKMBINDEX, TROPONINI in the last 168 hours. CBG: Recent Labs  Lab 07/12/18 1322 07/12/18 1649 07/12/18 2138 07/13/18 0554 07/13/18 1258  GLUCAP 158* 129* 138* 106* 88    Studies/Results: No results found. Medications: . sodium chloride 10 mL/hr at 07/10/18 1145  . ferric gluconate (FERRLECIT/NULECIT) IV     . sodium chloride   Intravenous Once  . amLODipine  10 mg Oral Daily  . calcitRIOL  2.5 mcg Oral Q T,Th,Sa-HD  . calcium carbonate  1,250 mg Oral BID WC  . Chlorhexidine Gluconate Cloth  6 each Topical Daily  . cloNIDine  0.1 mg Oral Daily  . Darbepoetin Alfa      . darbepoetin (ARANESP) injection - DIALYSIS  150 mcg Intravenous Q Sat-HD  . hydrALAZINE  37.5 mg Oral TID  . insulin aspart  0-5 Units Subcutaneous QHS  . insulin aspart  0-9 Units Subcutaneous TID WC  . isosorbide mononitrate  60 mg Oral Daily  . mupirocin ointment  1 application Nasal BID  . pantoprazole  40 mg Oral Daily

## 2018-07-14 NOTE — Anesthesia Postprocedure Evaluation (Signed)
Anesthesia Post Note  Patient: Roger Rollins  Procedure(s) Performed: LAPAROSCOPIC CHOLECYSTECTOMY WITH INTRAOPERATIVE CHOLANGIOGRAM (N/A Abdomen)     Patient location during evaluation: PACU Anesthesia Type: General Level of consciousness: awake and alert Pain management: pain level controlled Vital Signs Assessment: post-procedure vital signs reviewed and stable Respiratory status: spontaneous breathing, nonlabored ventilation, respiratory function stable and patient connected to nasal cannula oxygen Cardiovascular status: blood pressure returned to baseline and stable Postop Assessment: no apparent nausea or vomiting Anesthetic complications: no    Last Vitals:  Vitals:   07/13/18 1319 07/13/18 1608  BP: (!) 157/77 118/60  Pulse:    Resp: 12 17  Temp: 36.6 C 36.7 C  SpO2: 97% 98%    Last Pain:  Vitals:   07/13/18 1608  TempSrc: Oral  PainSc:                  Konica Stankowski

## 2018-07-15 LAB — BPAM RBC
BLOOD PRODUCT EXPIRATION DATE: 201912182359
BLOOD PRODUCT EXPIRATION DATE: 201912192359
BLOOD PRODUCT EXPIRATION DATE: 201912192359
Blood Product Expiration Date: 201912192359
Blood Product Expiration Date: 201912192359
Blood Product Expiration Date: 201912192359
ISSUE DATE / TIME: 201911210227
ISSUE DATE / TIME: 201911210943
ISSUE DATE / TIME: 201911211037
ISSUE DATE / TIME: 201911211037
UNIT TYPE AND RH: 5100
UNIT TYPE AND RH: 5100
UNIT TYPE AND RH: 5100
Unit Type and Rh: 5100
Unit Type and Rh: 5100
Unit Type and Rh: 5100

## 2018-07-15 LAB — TYPE AND SCREEN
ABO/RH(D): O POS
ANTIBODY SCREEN: NEGATIVE
UNIT DIVISION: 0
UNIT DIVISION: 0
Unit division: 0
Unit division: 0
Unit division: 0
Unit division: 0

## 2018-07-22 ENCOUNTER — Ambulatory Visit: Payer: Self-pay

## 2018-07-22 ENCOUNTER — Other Ambulatory Visit: Payer: Self-pay

## 2018-07-22 ENCOUNTER — Ambulatory Visit (INDEPENDENT_AMBULATORY_CARE_PROVIDER_SITE_OTHER): Payer: Self-pay | Admitting: Internal Medicine

## 2018-07-22 VITALS — BP 102/49 | HR 63 | Temp 97.7°F | Ht 68.0 in | Wt 155.8 lb

## 2018-07-22 DIAGNOSIS — Z09 Encounter for follow-up examination after completed treatment for conditions other than malignant neoplasm: Secondary | ICD-10-CM

## 2018-07-22 DIAGNOSIS — Z79899 Other long term (current) drug therapy: Secondary | ICD-10-CM

## 2018-07-22 DIAGNOSIS — Z9049 Acquired absence of other specified parts of digestive tract: Secondary | ICD-10-CM

## 2018-07-22 DIAGNOSIS — K661 Hemoperitoneum: Secondary | ICD-10-CM

## 2018-07-22 DIAGNOSIS — K5900 Constipation, unspecified: Secondary | ICD-10-CM

## 2018-07-22 LAB — CBC
HEMATOCRIT: 28.6 % — AB (ref 39.0–52.0)
Hemoglobin: 9 g/dL — ABNORMAL LOW (ref 13.0–17.0)
MCH: 31.1 pg (ref 26.0–34.0)
MCHC: 31.5 g/dL (ref 30.0–36.0)
MCV: 99 fL (ref 80.0–100.0)
NRBC: 0 % (ref 0.0–0.2)
Platelets: 175 10*3/uL (ref 150–400)
RBC: 2.89 MIL/uL — ABNORMAL LOW (ref 4.22–5.81)
RDW: 16 % — AB (ref 11.5–15.5)
WBC: 5.4 10*3/uL (ref 4.0–10.5)

## 2018-07-22 MED ORDER — POLYETHYLENE GLYCOL 3350 17 G PO PACK: 17.0000 g | PACK | Freq: Every day | ORAL | 0 refills | Status: AC

## 2018-07-22 MED ORDER — SENNOSIDES-DOCUSATE SODIUM 8.6-50 MG PO TABS: 1.0000 | ORAL_TABLET | Freq: Every day | ORAL | Status: AC

## 2018-07-22 NOTE — Assessment & Plan Note (Signed)
Patient was recently admitted from 11/18 to 11/23. He was found to have acute cholecystitis and had a lap cholecystectomy on 11/20. Surgery was uncomplicated; however, postoperative course was complicated by acute hypotension and down trending hemoglobin. Patient was subsequently taken back for exploratory laparoscopy on 11/21 and found to have an hemoperitoneum. Approximately 3 L of bloody fluid was removed from the abdomen. No source of the bleeding was identified however surgical snow was applied to the previous site of cholecystectomy. Hgb at discharge was stable at 8-9.  Patient reported still feeling weak and tired. Denies lightheadedness, dizziness, any blood in stool or urine or other signs of bleeding. Will recheck his Hgb today.   -f/u cbc

## 2018-07-22 NOTE — Assessment & Plan Note (Signed)
Patient reported he has felt constipated for the past week. He is having bowel movements however, reported they are very small in nature. He normally went once a day before surgery. He was informed its typical to have constipation after an abdominal surgery. Recommended taking miralax and senokot-s as needed daily for constipation.  -take miralax and senokot-s prn for constipation

## 2018-07-22 NOTE — Assessment & Plan Note (Signed)
Patient was recently admitted from 11/18 to 11/23. He was found to have acute cholecystitis and had a lap cholecystectomy on 11/20. Surgery was uncomplicated; however, postoperative course was complicated by acute hypotension and down trending hemoglobin. Patient was subsequently taken back for exploratory laparoscopy on 11/21 and found to have an hemoperitoneum. Approximately 3 L of bloody fluid was removed from the abdomen. No source of the bleeding was identified however surgical snow was applied to the previous site of cholecystectomy.   Patient reported he feels well today. Denies abdominal pain, nausea, vomiting, fever, chills, or any s/sx of bleeding. He is tolerating soft foods well. He said he has been constipated for the past week. He has a surgery follow up next week.   -f/u cbc -recommended patient to take miralax and senokot-s for constipation as needed

## 2018-07-22 NOTE — Progress Notes (Signed)
   CC: Hospital follow up s/p lap cholecystectomy complicated by hemoperitoneum   HPI:  Mr.Tavio Fritchman is a 57 y.o. with a PMHx listed below presenting for a hospital follow up s/p a laparoscopic cholecystectomy and CBC check.   For details of today's visit and the status of his chronic medical issues please refer to the assessment and plan.   Past Medical History:  Diagnosis Date  . Diabetes mellitus without complication (HCC)   . GERD (gastroesophageal reflux disease)   . Heart failure (HCC)   . Hypertension   . Renal disorder    Review of Systems:   Review of Systems  Constitutional: Positive for malaise/fatigue. Negative for chills and fever.  Respiratory: Negative for shortness of breath.   Cardiovascular: Negative for leg swelling.  Gastrointestinal: Positive for constipation. Negative for abdominal pain, nausea and vomiting.  Genitourinary: Negative for hematuria.  Neurological: Negative for dizziness, weakness and headaches.  Endo/Heme/Allergies: Does not bruise/bleed easily.    Physical Exam:  Vitals:   07/22/18 1532  BP: (!) 102/49  Pulse: 63  Temp: 97.7 F (36.5 C)  TempSrc: Oral  SpO2: 99%  Weight: 155 lb 12.8 oz (70.7 kg)  Height: 5\' 8"  (1.727 m)   Physical Exam  Constitutional: He is oriented to person, place, and time and well-developed, well-nourished, and in no distress.  Eyes: Conjunctivae are normal. Right eye exhibits no discharge. Left eye exhibits no discharge.  Cardiovascular: Normal rate, regular rhythm and normal heart sounds.  No murmur heard. Pulmonary/Chest: Effort normal and breath sounds normal. No respiratory distress. He has no wheezes. He has no rales.  Abdominal: Soft. Bowel sounds are normal. He exhibits no distension. There is no tenderness. There is no guarding.  Musculoskeletal: He exhibits no edema.  Neurological: He is alert and oriented to person, place, and time.  Skin: Skin is warm and dry.  Psychiatric: Memory,  affect and judgment normal.    Assessment & Plan:   See Encounters Tab for problem based charting.  Patient seen with Dr. Cleda DaubE. Hoffman

## 2018-07-22 NOTE — Patient Instructions (Signed)
Roger Rollins,  It was a pleasure seeing you today! I am so glad to see you are feeling better. Please make sure to go to your Surgery follow up appointment next week. Also, for your constipation take miralax and senokot-S daily as needed. Both of these medications are over the counter medications.  We can follow up in 6 months or sooner if you need anything!

## 2018-07-25 NOTE — Progress Notes (Signed)
Internal Medicine Clinic Attending  I saw and evaluated the patient.  I personally confirmed the key portions of the history and exam documented by Dr.  Rehman  and I reviewed pertinent patient test results.  The assessment, diagnosis, and plan were formulated together and I agree with the documentation in the resident's note.  

## 2018-08-01 ENCOUNTER — Telehealth: Payer: Self-pay | Admitting: Internal Medicine

## 2018-08-05 ENCOUNTER — Ambulatory Visit: Payer: Self-pay

## 2018-09-05 MED FILL — OMEPRAZOLE 20 MG CPDR: 20 | 30 days supply | Qty: 60 | Fill #2

## 2018-09-05 MED FILL — CloNIDine HCL 0.1 MG TAB: 0.1 | 30 days supply | Qty: 60 | Fill #1

## 2018-09-05 MED FILL — hydrALAZINE HCL 25 MG TABS: 25 | 30 days supply | Qty: 135 | Fill #1

## 2018-09-05 MED FILL — AMLODIPINE BESYLATE 10 MG T: 10 | 30 days supply | Qty: 30 | Fill #2

## 2018-09-05 MED FILL — ISOSORBIDE MN ER 60 MG TAB: 60 | 30 days supply | Qty: 30 | Fill #2

## 2018-10-15 ENCOUNTER — Encounter: Payer: Self-pay | Admitting: Internal Medicine

## 2018-10-20 IMAGING — DX DG CHEST 1V PORT
1 series · 2 of 2 positions shown · non-contrast
Comparison: None.

CLINICAL DATA: Shortness breath, patient on dialysis

EXAM:
PORTABLE CHEST 1 VIEW

[Series 1: chest · 0.14mm/px · 2 of 2 slices shown]
[im 1/2]
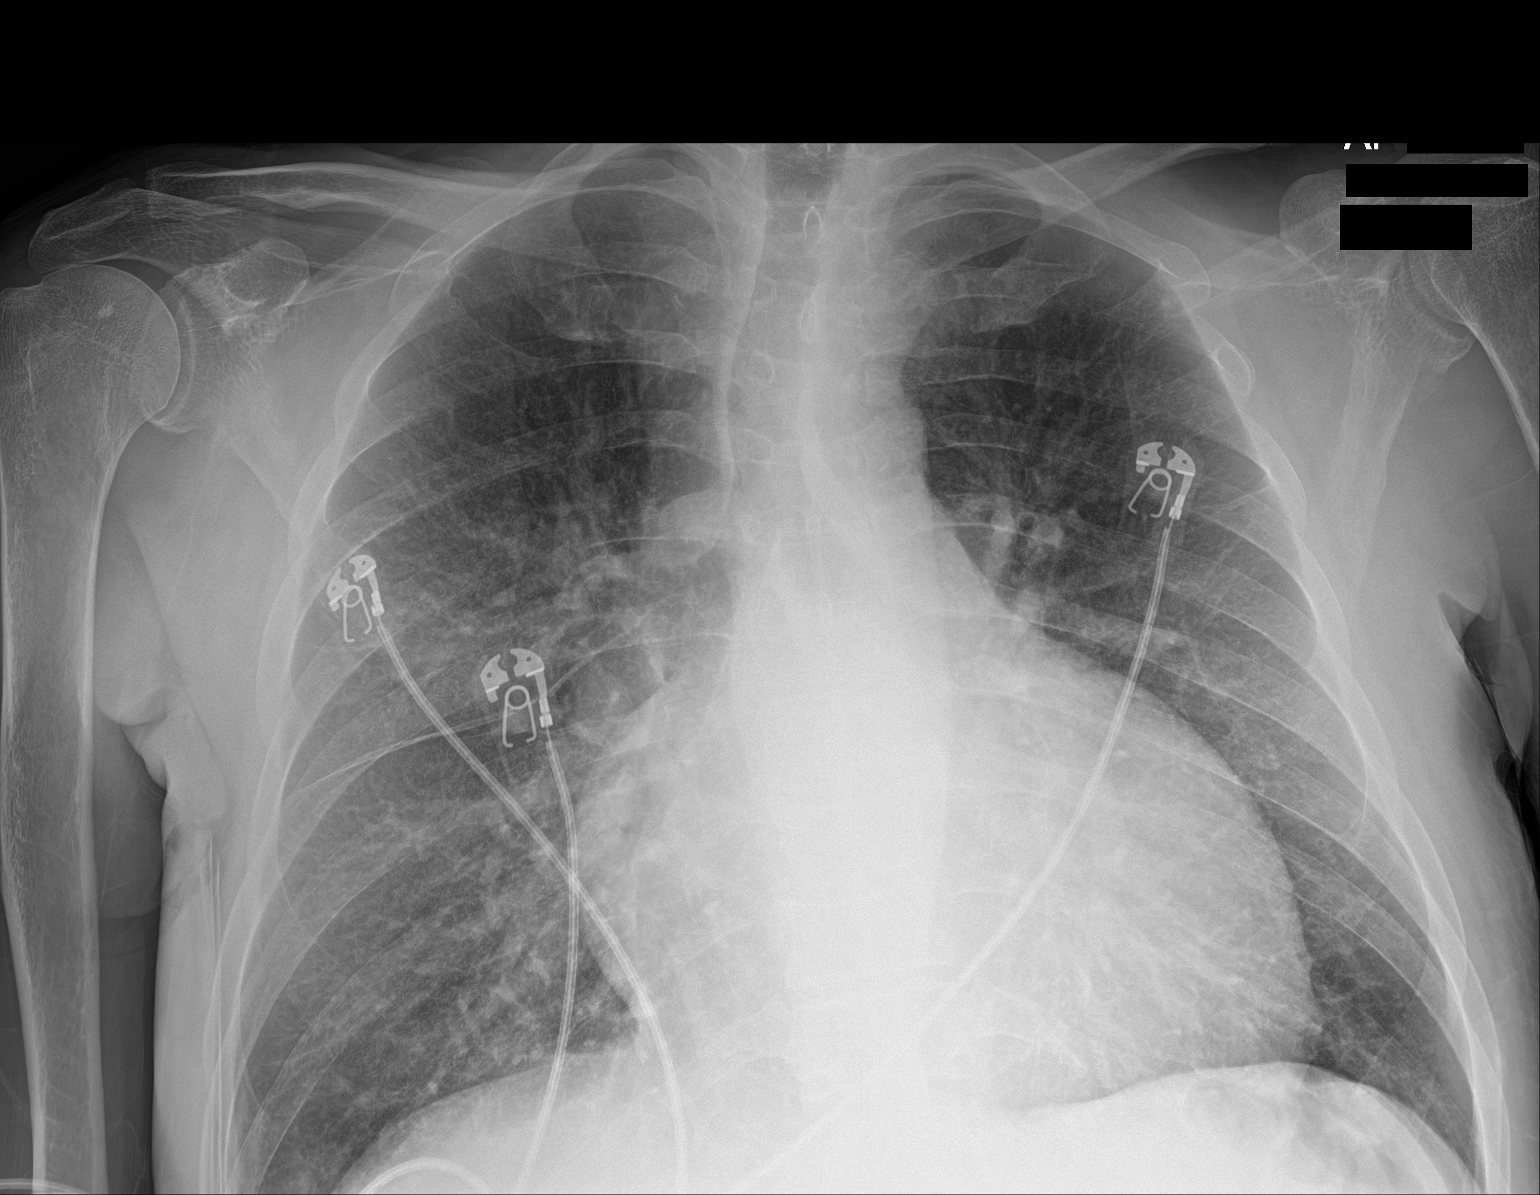
[im 2/2]
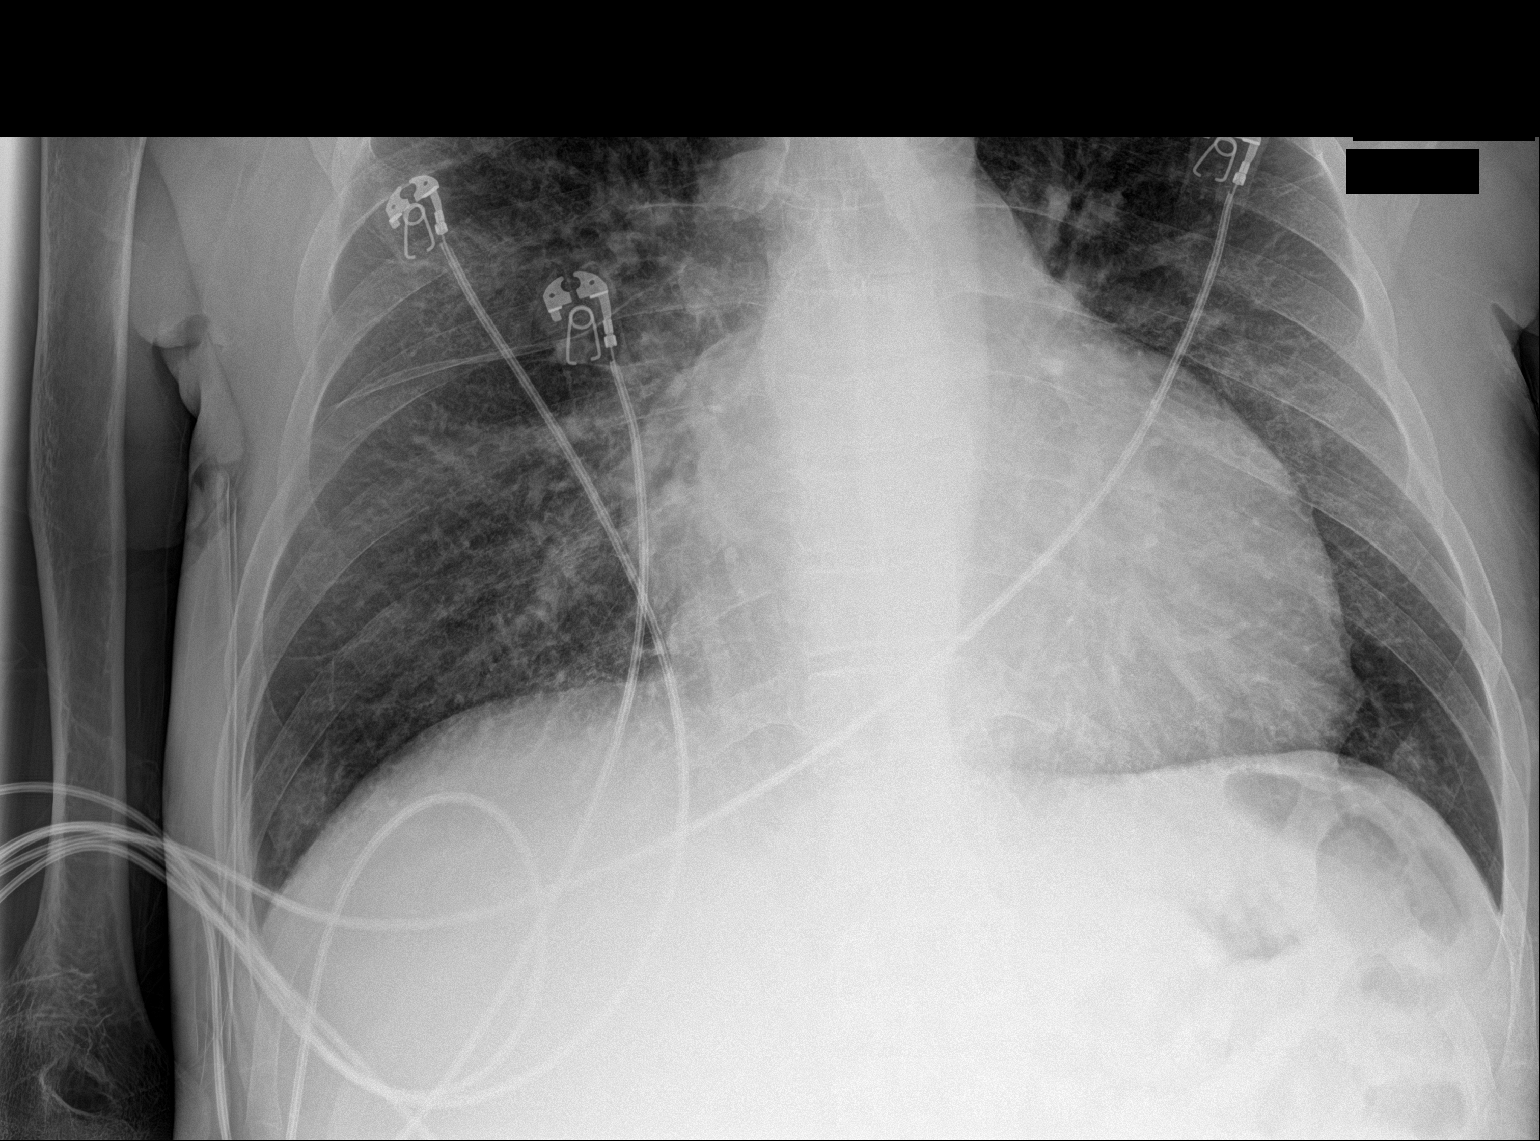

[2 of 2 positions shown; findings below may reference images not displayed]

FINDINGS: There is mild bilateral interstitial thickening. There is no focal
consolidation. There is no pleural effusion or pneumothorax. There
is cardiomegaly.

The osseous structures are unremarkable.
IMPRESSION: 1. Cardiomegaly with mild pulmonary vascular congestion.

## 2018-12-03 ENCOUNTER — Other Ambulatory Visit: Payer: Self-pay | Admitting: Internal Medicine

## 2018-12-03 DIAGNOSIS — E119 Type 2 diabetes mellitus without complications: Secondary | ICD-10-CM

## 2018-12-03 MED FILL — OMEPRAZOLE 20 MG CPDR: 20 | 30 days supply | Qty: 60 | Fill #0

## 2018-12-03 MED FILL — CARVEDILOL 25 MG TABLET: 25 | 30 days supply | Qty: 60 | Fill #0

## 2018-12-03 MED FILL — AMLODIPINE BESYLATE 10 MG T: 10 | 30 days supply | Qty: 30 | Fill #0

## 2018-12-03 MED FILL — hydrALAZINE HCL 25 MG TABS: 25 | 30 days supply | Qty: 135 | Fill #0

## 2018-12-03 MED FILL — ISOSORBIDE MN ER 60 MG TAB: 60 | 30 days supply | Qty: 30 | Fill #0

## 2018-12-03 MED FILL — CloNIDine HCL 0.1 MG TAB: 0.1 | 30 days supply | Qty: 60 | Fill #0

## 2019-02-03 ENCOUNTER — Encounter: Payer: Self-pay | Admitting: Internal Medicine

## 2019-02-16 ENCOUNTER — Encounter: Payer: Self-pay | Admitting: *Deleted

## 2019-08-11 ENCOUNTER — Ambulatory Visit: Payer: Self-pay

## 2019-09-17 ENCOUNTER — Telehealth: Payer: Self-pay | Admitting: Internal Medicine

## 2019-09-17 ENCOUNTER — Encounter: Payer: Self-pay | Admitting: Internal Medicine

## 2019-09-17 NOTE — Telephone Encounter (Signed)
-----   Message from Cecille Rubin sent at 09/16/2019  1:58 PM EST ----- Regarding: Patient Appointment Patient on A1C not done list, please contact patient to schedule an appointment. Last visit was 07/22/18 with Dr. Karilyn Cota.  Thank you,  Norma Fredrickson - Dietetic Intern

## 2019-09-17 NOTE — Telephone Encounter (Signed)
Attempted to contact, but no answer.  Left detailed message asking patient to call clinic back and schedule an appt.  Going to send patient letter also requesting the same.

## 2019-10-14 IMAGING — RF DG CHOLANGIOGRAM OPERATIVE
1 series · 4 of 4 positions shown · non-contrast
Comparison: Right upper quadrant abdominal ultrasound-07/08/2018

CLINICAL DATA: Intraoperative cholangiogram during laparoscopic
cholecystectomy.

EXAM:
INTRAOPERATIVE CHOLANGIOGRAM
FLUOROSCOPY TIME:  23 seconds

[Series 1: run · 4 of 28 frames shown]
[frame 1/28]
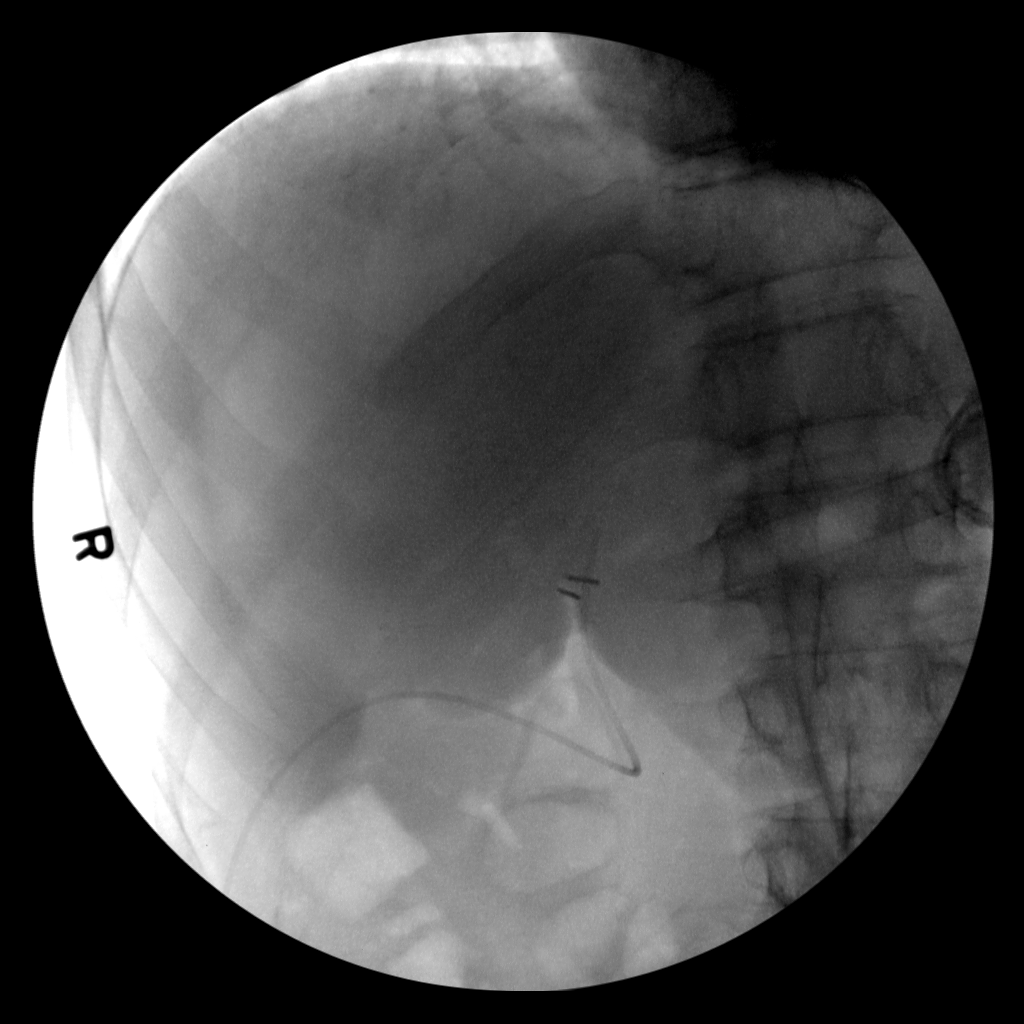
[frame 5/28]
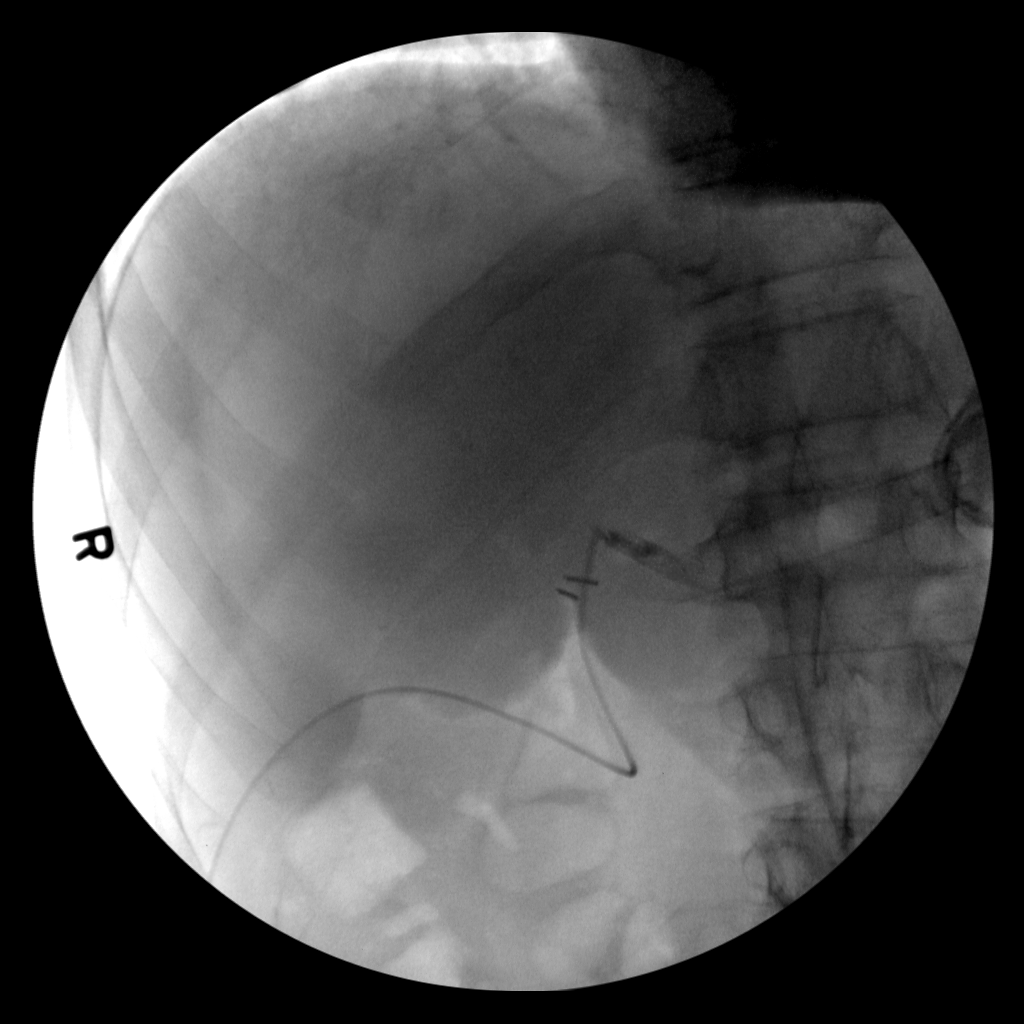
[frame 15/28]
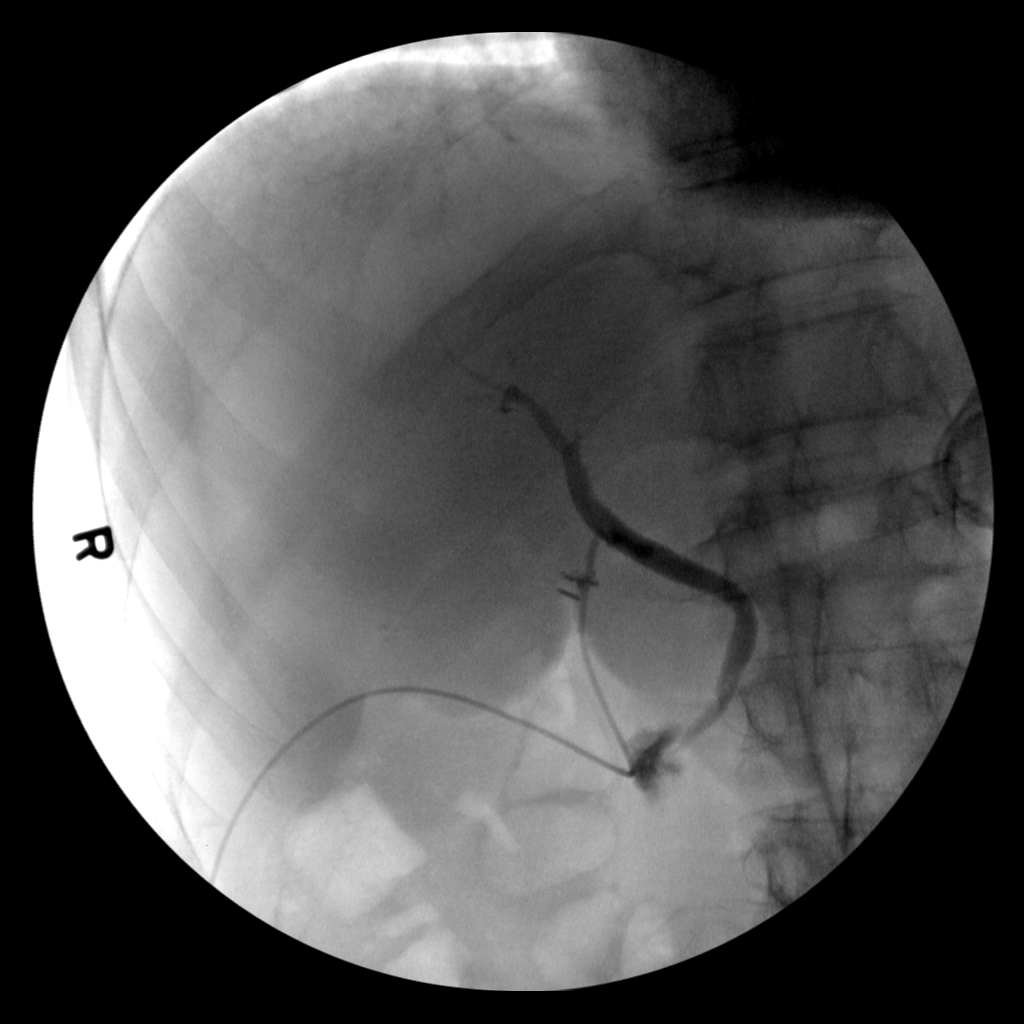
[frame 24/28]
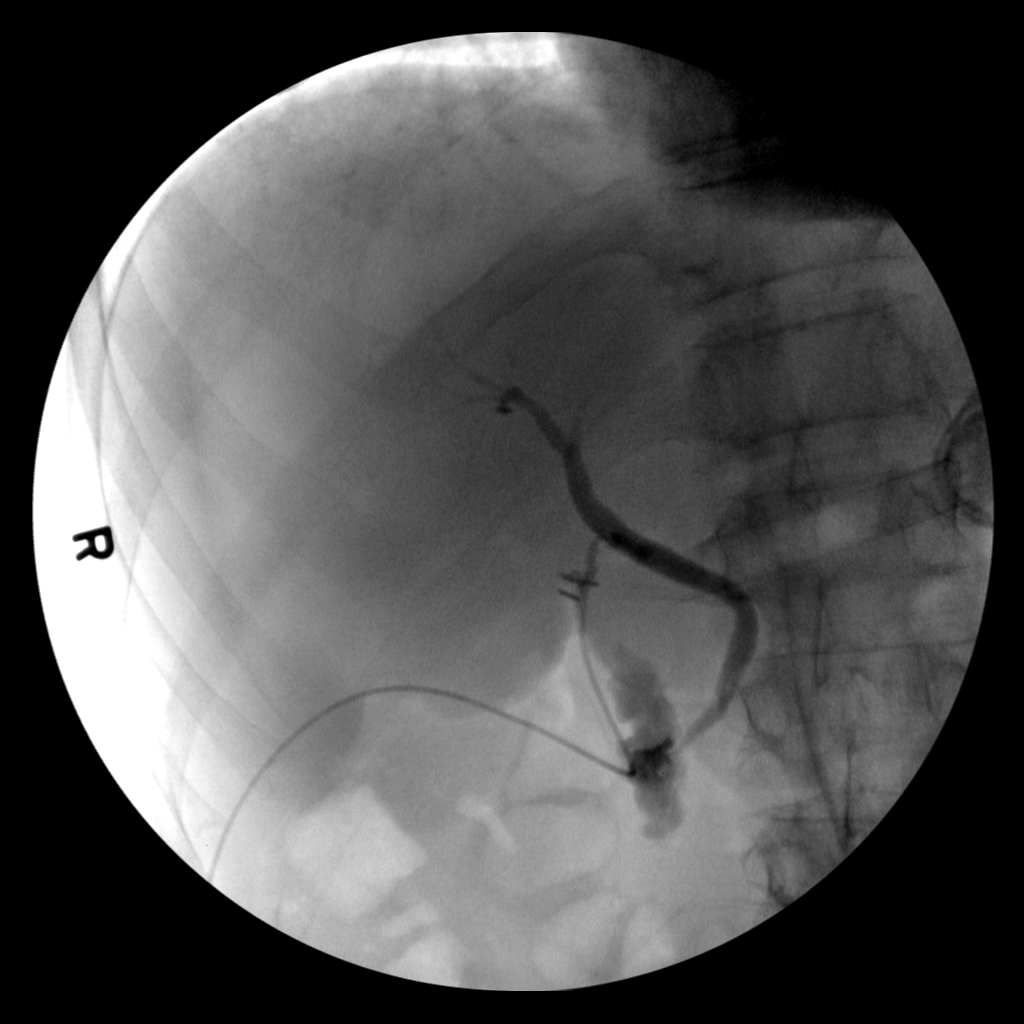

[4 of 4 positions shown; findings below may reference images not displayed]

FINDINGS: Intraoperative cholangiographic images of the right upper abdominal
quadrant during laparoscopic cholecystectomy are provided for
review.

Surgical clips overlie the expected location of the gallbladder
fossa.

Contrast injection demonstrates selective cannulation of the central
aspect of the cystic duct.

There is passage of contrast through the central aspect of the
cystic duct with filling of a non dilated common bile duct. There is
passage of contrast though the CBD and into the descending portion
of the duodenum.

There is minimal reflux of injected contrast into the common hepatic
duct and central aspect of the non dilated intrahepatic biliary
system.

There are no discrete filling defects within the opacified portions
of the biliary system to suggest the presence of
choledocholithiasis.
IMPRESSION: No evidence of choledocholithiasis.

## 2020-01-13 IMAGING — US US ABDOMEN LIMITED
1 series · 14 of 25 positions shown · non-contrast
Comparison: None.

CLINICAL DATA: Right upper quadrant pain. Hypertension and
diabetes.

EXAM:
ULTRASOUND ABDOMEN LIMITED RIGHT UPPER QUADRANT

[Series 1: us abdomen limited · 0.17mm/px · 14 of 84 slices shown]
[im 1/84]
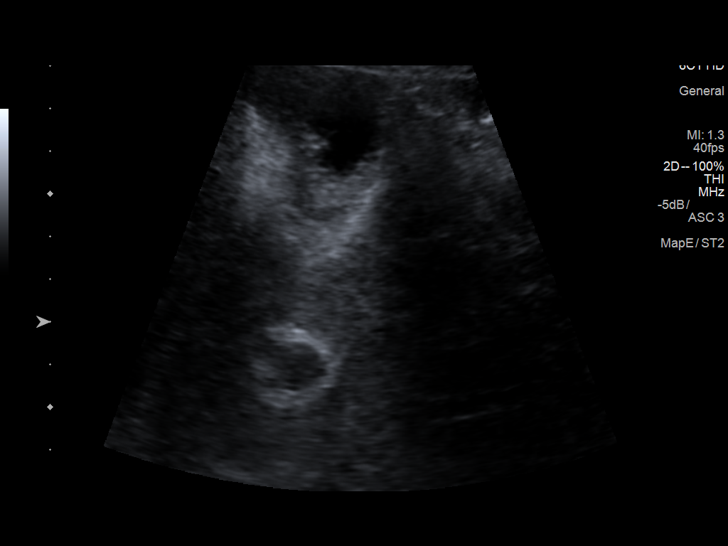
[im 7/84]
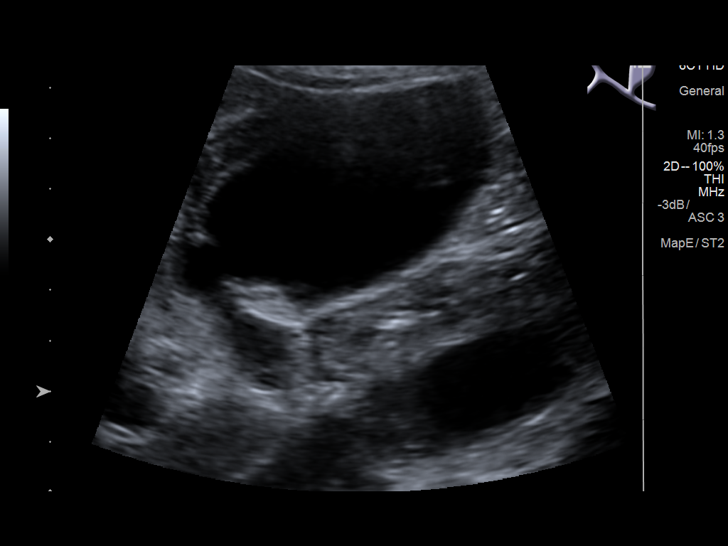
[im 14/84]
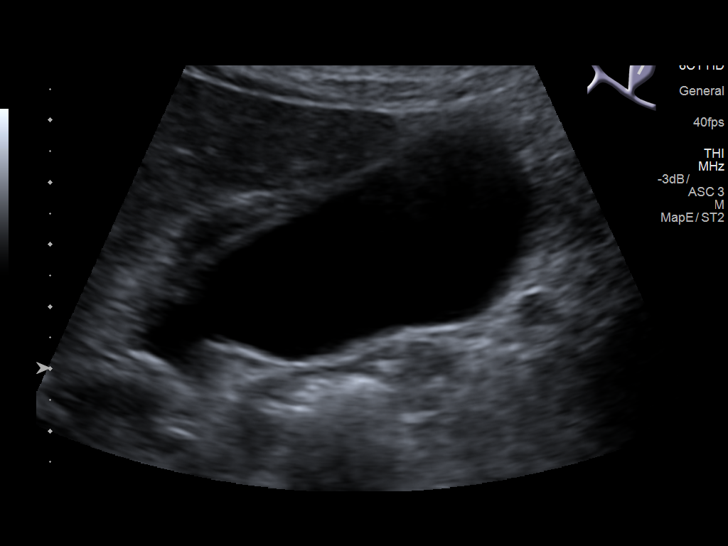
[im 21/84]
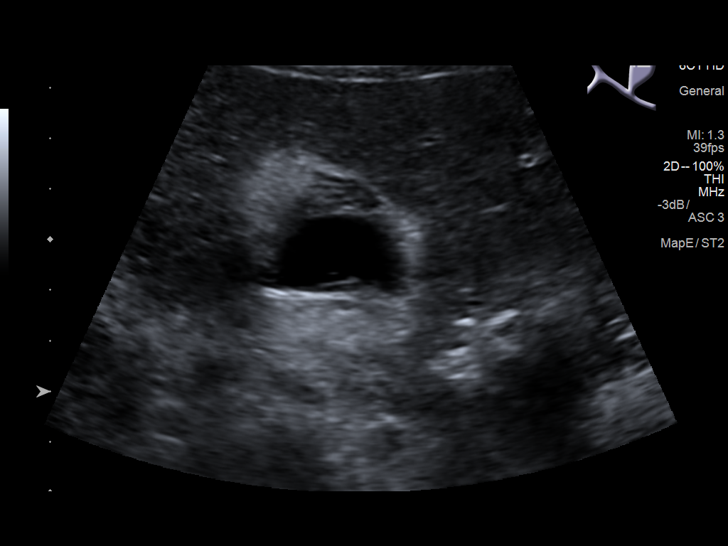
[im 28/84]
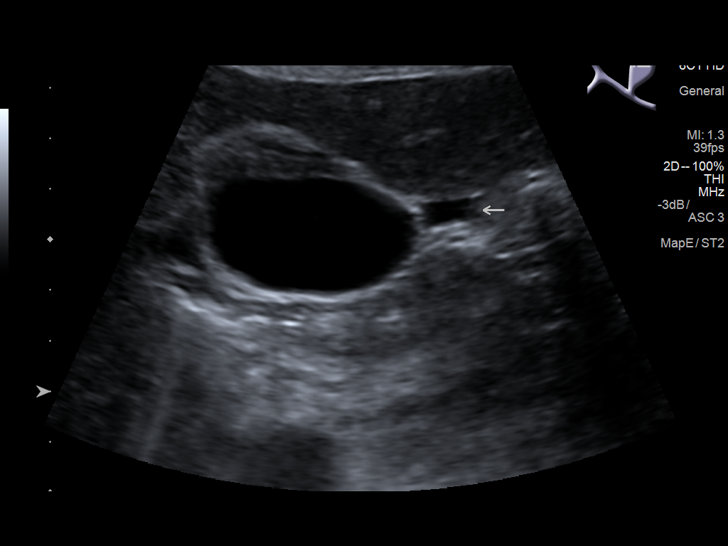
[im 32/84]
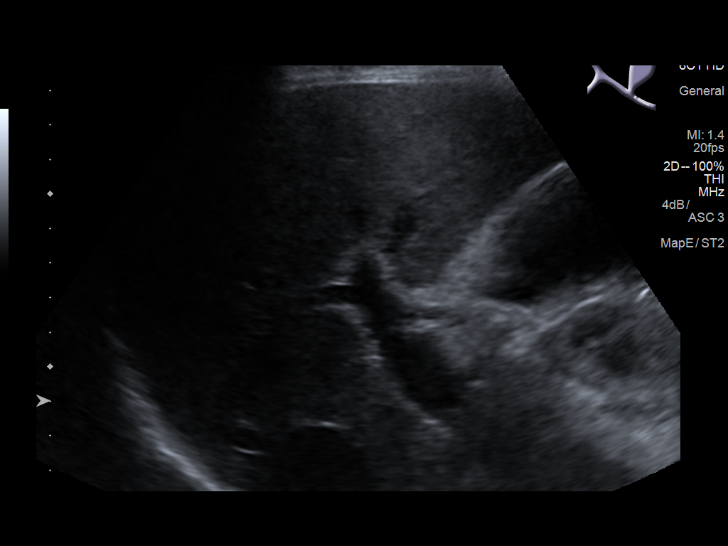
[im 39/84]
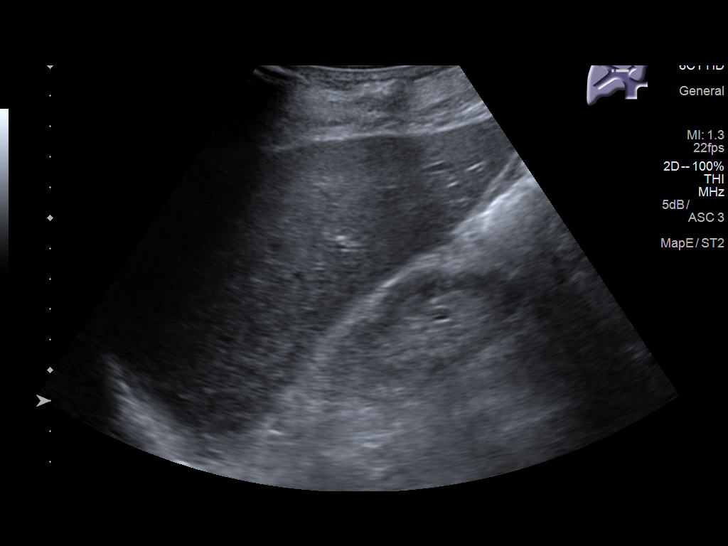
[im 45/84]
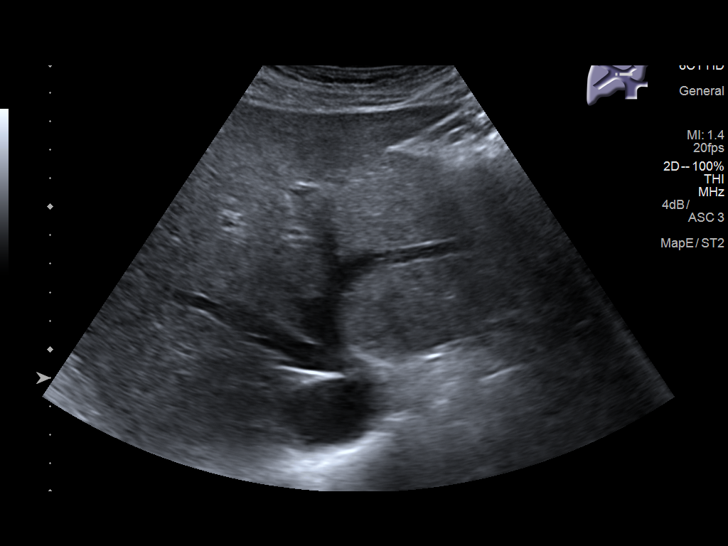
[im 52/84]
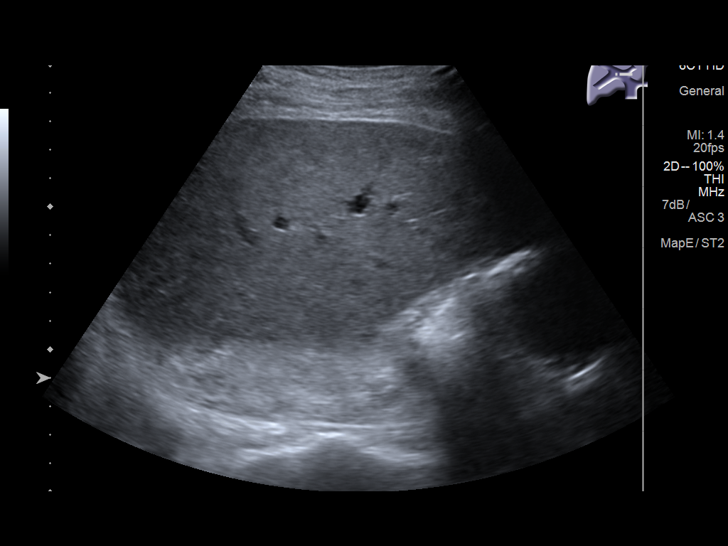
[im 56/84]
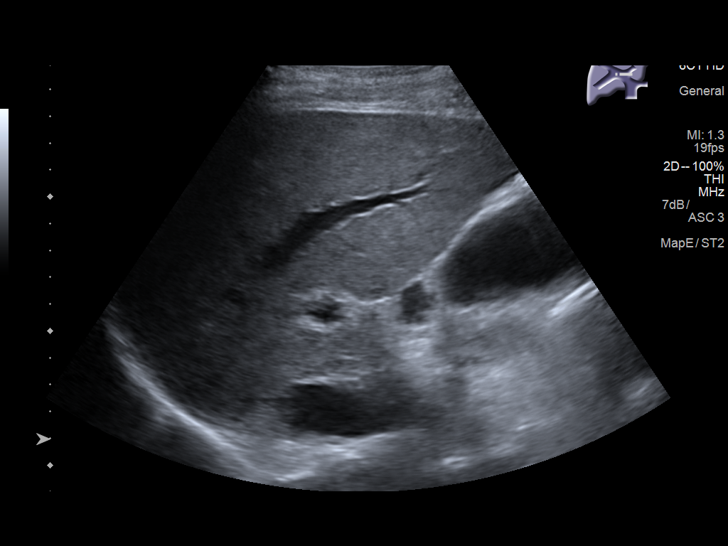
[im 63/84]
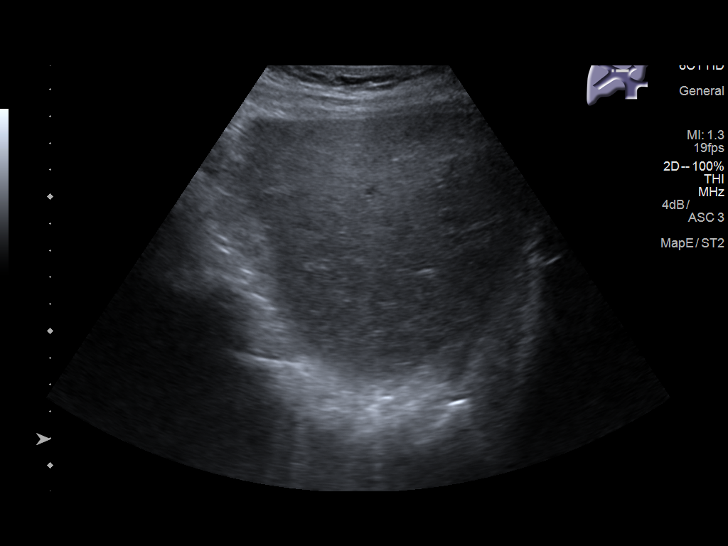
[im 70/84]
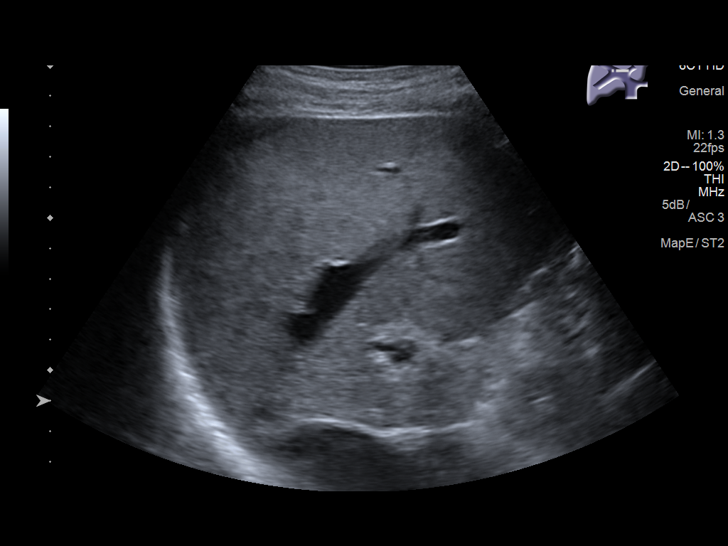
[im 77/84]
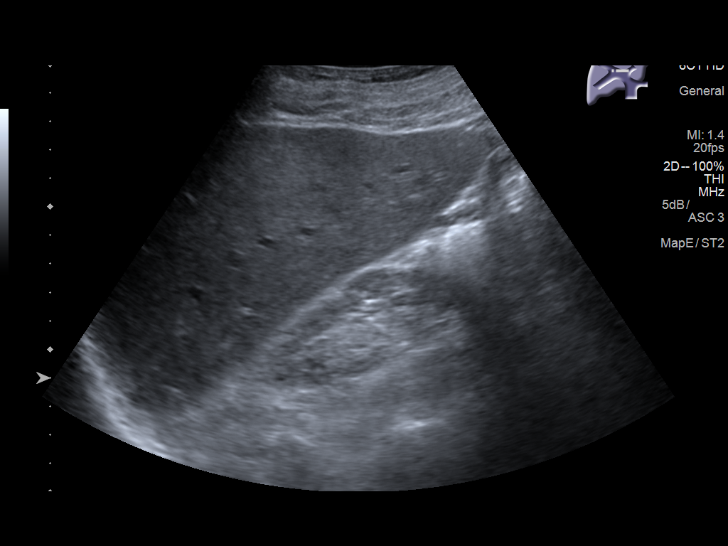
[im 84/84]
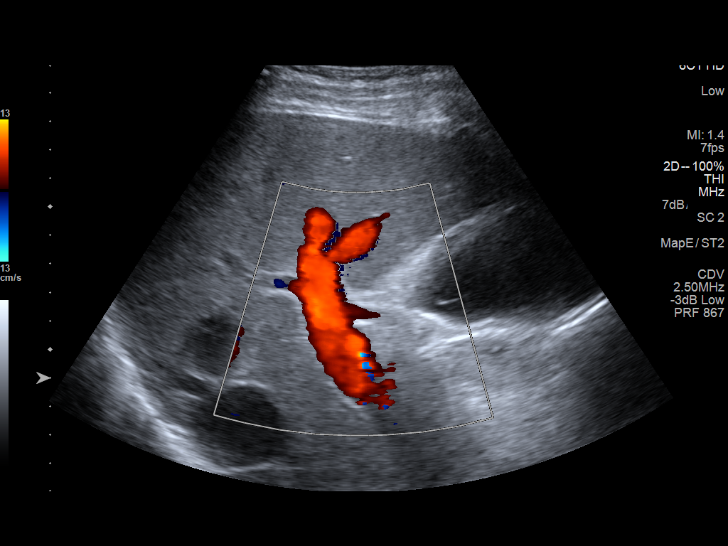

[14 of 25 positions shown; findings below may reference images not displayed]

FINDINGS: Gallbladder:

No gallstones identified. There is diffuse gallbladder wall edema
which measures 7.5 mm in thickness. Pericholecystic fluid noted.

Common bile duct:

Diameter: 5.5 mm.

Liver:

No focal lesion identified. Within normal limits in parenchymal
echogenicity. Intrahepatic biliary ductal dilatation noted. Portal
vein is patent on color Doppler imaging with normal direction of
blood flow towards the liver.
IMPRESSION: 1. Diffuse gallbladder wall edema with pericholecystic fluid
compatible with cholecystitis.
2. Intrahepatic bile duct dilatation with normal caliber common bile
duct.
